# Patient Record
Sex: Female | Born: 1940 | Race: White | Hispanic: No | Smoking: Former smoker
Health system: Southern US, Community
[De-identification: ages and names within clinical notes are randomized; demographics above are authoritative.]

## PROBLEM LIST (undated history)

## (undated) DIAGNOSIS — C801 Malignant (primary) neoplasm, unspecified: Secondary | ICD-10-CM

## (undated) DIAGNOSIS — T7840XA Allergy, unspecified, initial encounter: Secondary | ICD-10-CM

## (undated) DIAGNOSIS — R600 Localized edema: Secondary | ICD-10-CM

## (undated) DIAGNOSIS — R109 Unspecified abdominal pain: Secondary | ICD-10-CM

## (undated) DIAGNOSIS — Z85828 Personal history of other malignant neoplasm of skin: Secondary | ICD-10-CM

## (undated) DIAGNOSIS — G518 Other disorders of facial nerve: Secondary | ICD-10-CM

## (undated) DIAGNOSIS — I5042 Chronic combined systolic (congestive) and diastolic (congestive) heart failure: Secondary | ICD-10-CM

## (undated) DIAGNOSIS — L03115 Cellulitis of right lower limb: Secondary | ICD-10-CM

## (undated) DIAGNOSIS — I429 Cardiomyopathy, unspecified: Secondary | ICD-10-CM

## (undated) HISTORY — DX: Chronic combined systolic (congestive) and diastolic (congestive) heart failure: I50.42

## (undated) HISTORY — DX: Other disorders of facial nerve: G51.8

## (undated) HISTORY — PX: TONSILLECTOMY AND ADENOIDECTOMY: SHX28

## (undated) HISTORY — DX: Cellulitis of left lower limb: L03.115

## (undated) HISTORY — DX: Unspecified abdominal pain: R10.9

## (undated) HISTORY — DX: Malignant (primary) neoplasm, unspecified: C80.1

## (undated) HISTORY — DX: Allergy, unspecified, initial encounter: T78.40XA

## (undated) HISTORY — DX: Localized edema: R60.0

## (undated) HISTORY — DX: Cardiomyopathy, unspecified: I42.9

## (undated) HISTORY — DX: Personal history of other malignant neoplasm of skin: Z85.828

## (undated) HISTORY — PX: ABDOMINAL HYSTERECTOMY: SHX81

---

## 2001-02-19 ENCOUNTER — Encounter: Payer: Self-pay | Admitting: Emergency Medicine

## 2001-02-19 ENCOUNTER — Observation Stay (HOSPITAL_COMMUNITY): Admission: EM | Admit: 2001-02-19 | Discharge: 2001-02-20 | Payer: Self-pay | Admitting: Emergency Medicine

## 2001-06-21 ENCOUNTER — Encounter: Admission: RE | Admit: 2001-06-21 | Discharge: 2001-06-21 | Payer: Self-pay | Admitting: Internal Medicine

## 2001-06-21 ENCOUNTER — Encounter: Payer: Self-pay | Admitting: Internal Medicine

## 2002-10-18 ENCOUNTER — Encounter: Payer: Self-pay | Admitting: Internal Medicine

## 2002-10-18 ENCOUNTER — Encounter: Admission: RE | Admit: 2002-10-18 | Discharge: 2002-10-18 | Payer: Self-pay | Admitting: Internal Medicine

## 2003-10-31 ENCOUNTER — Encounter: Admission: RE | Admit: 2003-10-31 | Discharge: 2003-10-31 | Payer: Self-pay | Admitting: Internal Medicine

## 2004-10-17 ENCOUNTER — Ambulatory Visit: Payer: Self-pay | Admitting: Internal Medicine

## 2005-05-28 ENCOUNTER — Encounter: Admission: RE | Admit: 2005-05-28 | Discharge: 2005-05-28 | Payer: Self-pay | Admitting: Internal Medicine

## 2006-05-31 ENCOUNTER — Encounter: Admission: RE | Admit: 2006-05-31 | Discharge: 2006-05-31 | Payer: Self-pay | Admitting: Internal Medicine

## 2006-09-28 ENCOUNTER — Ambulatory Visit: Payer: Self-pay | Admitting: Internal Medicine

## 2006-09-28 LAB — CONVERTED CEMR LAB
Albumin: 3.7 g/dL (ref 3.5–5.2)
Basophils Relative: 0.1 % (ref 0.0–1.0)
CO2: 32 meq/L (ref 19–32)
Chloride: 104 meq/L (ref 96–112)
Chol/HDL Ratio, serum: 2.6
Cholesterol: 213 mg/dL (ref 0–200)
Creatinine, Ser: 0.7 mg/dL (ref 0.4–1.2)
Eosinophil percent: 2.6 % (ref 0.0–5.0)
Glucose, Bld: 81 mg/dL (ref 70–99)
HDL: 81.1 mg/dL (ref >39.0)
Monocytes Absolute: 0.5 10*3/uL (ref 0.2–0.7)
Neutro Abs: 3.4 10*3/uL (ref 1.4–7.7)
Platelets: 251 10*3/uL (ref 150–400)
RBC: 4.26 M/uL (ref 3.87–5.11)
Sodium: 144 meq/L (ref 135–145)
Total Protein: 6.4 g/dL (ref 6.0–8.3)
Triglyceride fasting, serum: 32 mg/dL (ref 0–149)
WBC: 6 10*3/uL (ref 4.5–10.5)

## 2006-10-05 ENCOUNTER — Ambulatory Visit: Payer: Self-pay | Admitting: Internal Medicine

## 2007-03-02 ENCOUNTER — Emergency Department (HOSPITAL_COMMUNITY): Admission: EM | Admit: 2007-03-02 | Discharge: 2007-03-03 | Payer: Self-pay | Admitting: Emergency Medicine

## 2007-03-27 ENCOUNTER — Emergency Department (HOSPITAL_COMMUNITY): Admission: EM | Admit: 2007-03-27 | Discharge: 2007-03-27 | Payer: Self-pay | Admitting: Emergency Medicine

## 2007-07-26 ENCOUNTER — Encounter: Admission: RE | Admit: 2007-07-26 | Discharge: 2007-07-26 | Payer: Self-pay | Admitting: Internal Medicine

## 2007-08-08 ENCOUNTER — Ambulatory Visit: Payer: Self-pay | Admitting: Internal Medicine

## 2008-03-30 ENCOUNTER — Ambulatory Visit: Payer: Self-pay | Admitting: Family Medicine

## 2008-03-30 DIAGNOSIS — L259 Unspecified contact dermatitis, unspecified cause: Secondary | ICD-10-CM | POA: Insufficient documentation

## 2008-07-30 ENCOUNTER — Encounter: Admission: RE | Admit: 2008-07-30 | Discharge: 2008-07-30 | Payer: Self-pay | Admitting: Internal Medicine

## 2008-08-02 ENCOUNTER — Encounter: Admission: RE | Admit: 2008-08-02 | Discharge: 2008-08-02 | Payer: Self-pay | Admitting: Internal Medicine

## 2009-03-13 ENCOUNTER — Ambulatory Visit: Payer: Self-pay | Admitting: Internal Medicine

## 2009-03-13 DIAGNOSIS — R634 Abnormal weight loss: Secondary | ICD-10-CM | POA: Insufficient documentation

## 2009-03-13 DIAGNOSIS — N63 Unspecified lump in unspecified breast: Secondary | ICD-10-CM

## 2009-03-13 LAB — CONVERTED CEMR LAB: Prealbumin: 23.2 mg/dL (ref 18.0–45.0)

## 2009-03-18 LAB — CONVERTED CEMR LAB
ALT: 22 units/L (ref 0–35)
Bilirubin, Direct: 0.1 mg/dL (ref 0.0–0.3)
Free T4: 0.9 ng/dL (ref 0.6–1.6)
TSH: 0.81 microintl units/mL (ref 0.35–5.50)
Total Bilirubin: 1.3 mg/dL — ABNORMAL HIGH (ref 0.3–1.2)

## 2009-04-02 ENCOUNTER — Encounter: Payer: Self-pay | Admitting: Internal Medicine

## 2009-04-02 ENCOUNTER — Encounter: Admission: RE | Admit: 2009-04-02 | Discharge: 2009-04-02 | Payer: Self-pay | Admitting: Surgery

## 2009-08-14 ENCOUNTER — Encounter: Admission: RE | Admit: 2009-08-14 | Discharge: 2009-08-14 | Payer: Self-pay | Admitting: Internal Medicine

## 2010-07-16 ENCOUNTER — Ambulatory Visit: Payer: Self-pay | Admitting: Internal Medicine

## 2010-08-26 ENCOUNTER — Encounter: Admission: RE | Admit: 2010-08-26 | Discharge: 2010-08-26 | Payer: Self-pay | Admitting: Internal Medicine

## 2010-11-22 ENCOUNTER — Encounter: Payer: Self-pay | Admitting: Internal Medicine

## 2010-11-23 ENCOUNTER — Encounter: Payer: Self-pay | Admitting: Internal Medicine

## 2010-12-02 NOTE — Assessment & Plan Note (Signed)
Summary: FLU SHOT/RCD   Nurse Visit   Review of Systems       Flu Vaccine Consent Questions     Do you have a history of severe allergic reactions to this vaccine? no    Any prior history of allergic reactions to egg and/or gelatin? no    Do you have a sensitivity to the preservative Thimersol? no    Do you have a past history of Guillan-Barre Syndrome? no    Do you currently have an acute febrile illness? no    Have you ever had a severe reaction to latex? no    Vaccine information given and explained to patient? yes    Are you currently pregnant? no    Lot Number:[id number]   Exp Date:05/02/2011   Site Given  Left Deltoid IM    Allergies: No Known Drug Allergies  Orders Added: 1)  Flu Vaccine 3yrs + MEDICARE PATIENTS [Q2039] 2)  Administration Flu vaccine - MCR [G0008] 

## 2011-03-20 NOTE — Discharge Summary (Signed)
[place]. [facility]  Patient:    Brandy Conway, Brandy Conway                           MRN: [id number] Adm. Date:  [id number] Disc. Date: [id number] Attending:  Carrie Mew CC:         Stacie Glaze, M.D. [facility]   Discharge Summary  ADMISSION DIAGNOSES: 1. Chest pain. 2. Recurrent abdominal pain. 3. Degenerative joint disease. 4. Peripheral vascular disease manifested as varicose veins.  DISCHARGE DIAGNOSES: 1. Chest pain. 2. Recurrent abdominal pain. 3. Degenerative joint disease. 4. Peripheral vascular disease manifested as varicose veins.  BRIEF ADMISSION HISTORY:  Brandy Conway is a 70 year old Caucasian female who developed chest pain on April 19 which was progressive over several hours.  It disappeared while asleep, but upon awakening April 20, she had paresthesias of the left upper extremity with tingling.  The chest pain subsequently returned and prompted an ER visit.  The pain was described as substernal or epigastric without radiation.  She had nausea, but no diaphoresis.  Significantly, she is physically active on a stationary bike daily for half an hour without cardiopulmonary symptoms.  There is no family history of stroke, heart attack, or diabetes.  She has been evaluated by Dr. Lovell Sheehan extensively for lower abdominal and back pain which has been intermittent over the last three and a half years.  She was admitted to rule out infarction.  She was admitted to telemetry with monitor.  LABORATORY AND X-RAY DATA:  Serial CPKs were within normal limits except for the second one with a value of 251.  The relative index was normal.  Troponin was negative x 3.  Her hematocrit was 34.8.  Amylase and lipase were normal.  EKG was normal with minor T wave changes manifested by an inverted T in aVL and flat T wave in V2.  Chest x-ray revealed no active disease.  HOSPITAL COURSE:  The pain resolved, and her only complaint on the morning of April 21 was  fatigue from being bedridden as she is normally physically active.  In view of the negative tests above including comprehensive metabolic profile, she will be discharged with an outpatient workup.  Outpatient stress cardiolite will be scheduled.  She will be asked to curtail her exercise pattern until that stress Cardiolite is performed.  She did receive IV heparin as per protocol during the hospitalization, but this was stopped the morning of April 21.  DISCHARGE MEDICATIONS:  She was discharged on Protonix 40 mg q.d.  DISCHARGE INSTRUCTIONS:  Diet will be of choice.  Activities are as noted with the above restrictions.  SPECIAL DISCHARGE INSTRUCTIONS:  Because of the mild anemia manifested on repeat CBC with hematocrit of 34.8, outpatient fecal occult blood testing would be recommended.  Her initial hematocrit on April 20 was 40.6 prior to multiple blood draws and IV fluids. DD:  02/20/01 TD:  02/21/01 Job: [id number]

## 2011-03-20 NOTE — H&P (Signed)
[place]. [facility]  Patient:    Brandy Conway, Brandy Conway                           MRN: [id number] Adm. Date:  [id number] Disc. Date: [id number] Attending:  Carrie Mew CC:         Stacie Glaze, M.D. [facility]   History and Physical  DATE OF BIRTH: [birth year]  CHIEF COMPLAINT: Brandy Conway is a 70 year old white female admitted with chest pain to rule out crescendo angina.  HISTORY OF PRESENT ILLNESS: She began to have chest pain on February 18, 2001. She stated that it progressed through the afternoon into the evening.  She went to bed and did not treat the pain.  Upon awakening it had resolved but she had paresthesias of the left upper extremity and hand, manifested as tingling.  The chest pain returned as of 11 a.m. today, prompting the ER visit.  She has had intermittent chest pain after climbing stairs associated with shortness of breath.  Significantly, she also uses a stationary bike for half an hour a day without cardiopulmonary symptoms, and also walks.  The pain was described as being in the epigastric substernal area without radiation.  It was associated with nausea but no diaphoresis.  PAST MEDICAL/SURGICAL HISTORY:  1. Tonsillectomy and adenoidectomy.  2. One pregnancy, one delivery.  3. Total abdominal hysterectomy for dysfunction menses.  4. As a teen she was hospitalized with abdominal pain but did not have an     appendectomy.  FAMILY HISTORY: There is no heart attack, stroke, or diabetes in the family. Her mother had congestive heart failure.  A son tragically died at age 78-1/2 of CNS malignancy.  A grandfather had cancer, questionably of the liver.  SOCIAL HISTORY: She quit smoking 25 years ago.  She drinks occasionally.  She has a strong spiritual faith.  CURRENT MEDICATIONS:  1. Aspirin.  2. Multivitamin.  She is on no other prescription drugs.  ALLERGIES: No known drug allergies.  REVIEW OF SYSTEMS: Some lower  abdominal pain with back radiation.  This has been attributed to degenerative joint disease after detailed multiple evaluations by Dr. Lovell Sheehan over the last 3-1/2 years.  It had been her concern that she had ovarian cancer but apparent radiographic studies and endoscopic studies have been negative.  She did have microscopic hematuria several years ago and saw Dr. Annabell Howells.  She had a varicose vein bleed dramatically and had to have it sutured in the emergency room.  PHYSICAL EXAMINATION:  GENERAL: She is thin, but in no acute distress.  She has no lymphadenopathy or organomegaly.  VITAL SIGNS: Pulse 97, with normal sinus rhythm on telemetry.  Respiratory rate was 17-20.  CHEST:  No increased work of breathing.  Chest clear.  There is a prominent pulsating artery above the arterial structure above the left clavicle.  HEENT: Fundal examination normal.  Otolaryngologic examination is negative. Tympanic membranes are slightly dull but there is no bulging suggested. Dental hygiene is excellent.  HEART: She has an S4 without murmur or gallop.  NECK: Thyroid asymmetric, right greater than left.  ABDOMEN: Slightly tender over the lower abdomen, but she has no masses.  EXTREMITIES: Stasis dermatitis changes are noted over the lower extremities in splotchy distribution.  She has varicose veins over the lower extremities. Homans sign is negative.  Pedal pulses are intact.  EKG reveals incomplete right bundle branch block, with no  ischemic change.  IMPRESSION/PLAN: She is admitted to rule out impending infarction.  She will be placed on heparin protocol but because of the past history of abdominal pain Hemoccult studies will be performed.  If cardiac enzymes are negative a stress Cardiolite as an outpatient could be pursued. DD:  02/20/00 TD:  02/21/01 Job: [id number]

## 2011-08-28 ENCOUNTER — Other Ambulatory Visit: Payer: Self-pay | Admitting: Internal Medicine

## 2011-08-28 DIAGNOSIS — Z1231 Encounter for screening mammogram for malignant neoplasm of breast: Secondary | ICD-10-CM

## 2011-09-23 ENCOUNTER — Ambulatory Visit
Admission: RE | Admit: 2011-09-23 | Discharge: 2011-09-23 | Disposition: A | Discharge status: Home / Self Care | Payer: PRIVATE HEALTH INSURANCE | Source: Ambulatory Visit | Attending: Internal Medicine | Admitting: Internal Medicine

## 2011-09-23 DIAGNOSIS — Z1231 Encounter for screening mammogram for malignant neoplasm of breast: Secondary | ICD-10-CM

## 2012-05-13 ENCOUNTER — Encounter: Payer: Self-pay | Admitting: Internal Medicine

## 2012-05-13 ENCOUNTER — Ambulatory Visit (INDEPENDENT_AMBULATORY_CARE_PROVIDER_SITE_OTHER): Payer: Medicare Other | Admitting: Internal Medicine

## 2012-05-13 VITALS — BP 136/80 | HR 80 | Temp 98.1°F | Resp 16 | Ht 67.0 in | Wt 124.0 lb

## 2012-05-13 DIAGNOSIS — J019 Acute sinusitis, unspecified: Secondary | ICD-10-CM | POA: Diagnosis not present

## 2012-05-13 DIAGNOSIS — B9689 Other specified bacterial agents as the cause of diseases classified elsewhere: Secondary | ICD-10-CM

## 2012-05-13 MED ORDER — MOXIFLOXACIN HCL 400 MG PO TABS: 400.0000 mg | ORAL_TABLET | Freq: Every day | ORAL | Status: AC

## 2012-05-13 NOTE — Patient Instructions (Addendum)
Mucinex cough and cold fast Max liquid Take as directed on the bottle

## 2012-05-13 NOTE — Progress Notes (Signed)
  Subjective:    Patient ID: Brandy Conway, female    DOB: 1941-10-30, 71 y.o.   MRN: [id number]  HPI The pt noted cough and congestion Worse when she lays down Running a fever Under stress with husband serious illness Cough is non productive Protein calorie malnutrition Increased abd pain with cough     Review of Systems  Constitutional: Negative for activity change, appetite change and fatigue.  HENT: Positive for congestion and rhinorrhea. Negative for ear pain, neck pain, postnasal drip and sinus pressure.   Eyes: Negative for redness and visual disturbance.  Respiratory: Positive for cough, shortness of breath and stridor. Negative for wheezing.   Gastrointestinal: Negative for abdominal pain and abdominal distention.  Genitourinary: Negative for dysuria, frequency and menstrual problem.  Musculoskeletal: Negative for myalgias, joint swelling and arthralgias.  Skin: Negative for rash and wound.  Neurological: Negative for dizziness, weakness and headaches.  Hematological: Negative for adenopathy. Does not bruise/bleed easily.  Psychiatric/Behavioral: Negative for disturbed wake/sleep cycle and decreased concentration.       Objective:   Physical Exam  Constitutional: She appears well-developed and well-nourished.  Eyes: Conjunctivae are normal. Pupils are equal, round, and reactive to light.  Pulmonary/Chest: Effort normal and breath sounds normal.  Skin: Skin is warm and dry.    Marked drainage from sinuses Posterior pharynx is erythematous lung fields are clear      Assessment & Plan:  I think she has a severe sinus infection with postnasal drip the postnasal drip is severe enough that this is causing a choking sensation causing her cough when she lies down we will treat her with an antibiotic Avelox for 7 days and will suggest Robitussin Cough and cold fast Max liquid to control the congestion and cough.

## 2012-07-24 DIAGNOSIS — Z23 Encounter for immunization: Secondary | ICD-10-CM | POA: Diagnosis not present

## 2012-08-29 ENCOUNTER — Other Ambulatory Visit: Payer: Self-pay | Admitting: Internal Medicine

## 2012-08-29 DIAGNOSIS — Z1231 Encounter for screening mammogram for malignant neoplasm of breast: Secondary | ICD-10-CM

## 2012-10-10 ENCOUNTER — Ambulatory Visit
Admission: RE | Admit: 2012-10-10 | Discharge: 2012-10-10 | Disposition: A | Discharge status: Home / Self Care | Payer: Medicare Other | Source: Ambulatory Visit | Attending: Internal Medicine | Admitting: Internal Medicine

## 2012-10-10 DIAGNOSIS — Z1231 Encounter for screening mammogram for malignant neoplasm of breast: Secondary | ICD-10-CM | POA: Diagnosis not present

## 2013-07-19 ENCOUNTER — Ambulatory Visit (INDEPENDENT_AMBULATORY_CARE_PROVIDER_SITE_OTHER): Payer: Medicare Other | Admitting: Internal Medicine

## 2013-07-19 DIAGNOSIS — Z23 Encounter for immunization: Secondary | ICD-10-CM | POA: Diagnosis not present

## 2014-05-02 ENCOUNTER — Other Ambulatory Visit: Payer: Self-pay

## 2014-05-02 DIAGNOSIS — Z1231 Encounter for screening mammogram for malignant neoplasm of breast: Secondary | ICD-10-CM

## 2014-05-22 ENCOUNTER — Encounter (INDEPENDENT_AMBULATORY_CARE_PROVIDER_SITE_OTHER): Payer: Self-pay

## 2014-05-22 ENCOUNTER — Ambulatory Visit
Admission: RE | Admit: 2014-05-22 | Discharge: 2014-05-22 | Disposition: A | Discharge status: Home / Self Care | Payer: Medicare Other | Source: Ambulatory Visit

## 2014-05-22 DIAGNOSIS — Z1231 Encounter for screening mammogram for malignant neoplasm of breast: Secondary | ICD-10-CM

## 2014-06-27 DIAGNOSIS — I8390 Asymptomatic varicose veins of unspecified lower extremity: Secondary | ICD-10-CM | POA: Diagnosis not present

## 2014-06-27 DIAGNOSIS — L821 Other seborrheic keratosis: Secondary | ICD-10-CM | POA: Diagnosis not present

## 2014-06-27 DIAGNOSIS — Z85828 Personal history of other malignant neoplasm of skin: Secondary | ICD-10-CM | POA: Diagnosis not present

## 2014-06-27 DIAGNOSIS — D485 Neoplasm of uncertain behavior of skin: Secondary | ICD-10-CM | POA: Diagnosis not present

## 2014-06-27 DIAGNOSIS — L570 Actinic keratosis: Secondary | ICD-10-CM | POA: Diagnosis not present

## 2014-06-27 DIAGNOSIS — C44319 Basal cell carcinoma of skin of other parts of face: Secondary | ICD-10-CM | POA: Diagnosis not present

## 2014-06-27 DIAGNOSIS — I83893 Varicose veins of bilateral lower extremities with other complications: Secondary | ICD-10-CM | POA: Diagnosis not present

## 2014-07-17 DIAGNOSIS — M2040 Other hammer toe(s) (acquired), unspecified foot: Secondary | ICD-10-CM | POA: Diagnosis not present

## 2014-07-25 DIAGNOSIS — C44319 Basal cell carcinoma of skin of other parts of face: Secondary | ICD-10-CM | POA: Diagnosis not present

## 2014-08-10 DIAGNOSIS — Z23 Encounter for immunization: Secondary | ICD-10-CM | POA: Diagnosis not present

## 2014-09-24 ENCOUNTER — Ambulatory Visit: Payer: Medicare Other | Admitting: Family Medicine

## 2014-09-24 ENCOUNTER — Encounter: Payer: Self-pay | Admitting: Family Medicine

## 2014-09-24 ENCOUNTER — Ambulatory Visit (INDEPENDENT_AMBULATORY_CARE_PROVIDER_SITE_OTHER): Payer: Medicare Other | Admitting: Family Medicine

## 2014-09-24 VITALS — BP 100/64 | HR 103 | Temp 98.5°F | Wt 123.0 lb

## 2014-09-24 DIAGNOSIS — G500 Trigeminal neuralgia: Secondary | ICD-10-CM | POA: Insufficient documentation

## 2014-09-24 DIAGNOSIS — Z85828 Personal history of other malignant neoplasm of skin: Secondary | ICD-10-CM | POA: Insufficient documentation

## 2014-09-24 DIAGNOSIS — E785 Hyperlipidemia, unspecified: Secondary | ICD-10-CM | POA: Insufficient documentation

## 2014-09-24 DIAGNOSIS — Z23 Encounter for immunization: Secondary | ICD-10-CM | POA: Diagnosis not present

## 2014-09-24 HISTORY — DX: Hyperlipidemia, unspecified: E78.5

## 2014-09-24 HISTORY — DX: Trigeminal neuralgia: G50.0

## 2014-09-24 LAB — CBC
HCT: 42.5 % (ref 36.0–46.0)
Hemoglobin: 13.9 g/dL (ref 12.0–15.0)
MCHC: 32.6 g/dL (ref 30.0–36.0)
MCV: 92.9 fl (ref 78.0–100.0)
Platelets: 224 10*3/uL (ref 150.0–400.0)
RBC: 4.58 Mil/uL (ref 3.87–5.11)
RDW: 13.1 % (ref 11.5–15.5)
WBC: 4.3 10*3/uL (ref 4.0–10.5)

## 2014-09-24 LAB — LIPID PANEL
Cholesterol: 217 mg/dL — ABNORMAL HIGH (ref 0–200)
HDL: 96 mg/dL (ref >39.00)
LDL CALC: 110 mg/dL — AB (ref 0–99)
NONHDL: 121
TRIGLYCERIDES: 54 mg/dL (ref 0.0–149.0)
Total CHOL/HDL Ratio: 2
VLDL: 10.8 mg/dL (ref 0.0–40.0)

## 2014-09-24 LAB — COMPREHENSIVE METABOLIC PANEL
ALT: 16 U/L (ref 0–35)
AST: 22 U/L (ref 0–37)
Albumin: 4.6 g/dL (ref 3.5–5.2)
Alkaline Phosphatase: 47 U/L (ref 39–117)
BILIRUBIN TOTAL: 0.8 mg/dL (ref 0.2–1.2)
BUN: 13 mg/dL (ref 6–23)
CHLORIDE: 104 meq/L (ref 96–112)
CO2: 27 meq/L (ref 19–32)
Calcium: 9.8 mg/dL (ref 8.4–10.5)
Creatinine, Ser: 0.6 mg/dL (ref 0.4–1.2)
GFR: 112.78 mL/min (ref >60.00)
GLUCOSE: 100 mg/dL — AB (ref 70–99)
Potassium: 4.7 mEq/L (ref 3.5–5.1)
Sodium: 143 mEq/L (ref 135–145)
Total Protein: 7.1 g/dL (ref 6.0–8.3)

## 2014-09-24 LAB — TSH: TSH: 1.19 u[IU]/mL (ref 0.35–4.50)

## 2014-09-24 NOTE — Progress Notes (Signed)
[name], MD Phone: [phone]  Subjective:  Patient presents today to establish care with me as their new primary care provider. Patient was formerly a patient of Dr. Arnoldo Morale. Chief complaint-noted.   Hyperlipidemia-mild poor control Last LDL >120 in 2010.  On statin: no Regular exercise: through retirement community-does 5 days a week for 30 minutes, Nustep for another 30 minutes ROS- no chest pain or shortness of breath. No myalgias Denies depressive symptoms but occasional day where is tearful especially around the holidays. left sided headache with recent flare of facial nerve pain  The following were reviewed and entered/updated in epic: Past Medical History  Diagnosis Date  . Allergy   . Facial nerve sensory disorder   . History of skin cancer    Patient Active Problem List   Diagnosis Date Noted  . Hyperlipidemia 09/24/2014    Priority: Medium  . Trigeminal neuralgia 09/24/2014    Priority: Low  . History of skin cancer    Past Surgical History  Procedure Laterality Date  . Abdominal hysterectomy    . Tonsillectomy and adenoidectomy      Family History  Problem Relation Age of Onset  . Heart disease Mother     CHF  . Heart disease Brother 75    stents    Medications- reviewed and updated Current Outpatient Prescriptions  Medication Sig Dispense Refill  . aspirin 325 MG buffered tablet Take 325 mg by mouth as needed.     No current facility-administered medications for this visit.    Allergies-reviewed and updated No Known Allergies  History   Social History  . Marital Status: Married    Spouse Name: N/A    Number of Children: N/A  . Years of Education: N/A   Social History Main Topics  . Smoking status: Former Smoker -- 0.50 packs/day for 10 years    Types: Cigarettes    Quit date: 11/02/1973  . Smokeless tobacco: None  . Alcohol Use: No  . Drug Use: No  . Sexual Activity: No   Other Topics Concern  . None   Social History  Narrative   Widowed in March 2015-sick for a long time. 1 son- lost at age 19.5 sudden death-malignant brain tumor ruptured. No pets.       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 No rebound or guarding.  Ext: trace edema despite compression stockings Skin: warm, dry, no rash, 2 x 2 cm healed scar on top of head from skin cancer removal without signs of reoccurence Neuro: grossly normal, moves all extremities   Results for orders placed or performed in visit on 09/24/14 (from the past 24 hour(s))  CBC     Status: None   Collection Time: 09/24/14 12:18 PM  Result Value Ref Range   WBC 4.3 4.0 - 10.5 K/uL   RBC 4.58 3.87 - 5.11 Mil/uL   Platelets 224.0 150.0 - 400.0 K/uL   Hemoglobin 13.9 12.0 - 15.0 g/dL   HCT 42.5 36.0 - 46.0 %   MCV 92.9 78.0 -  100.0 fl   MCHC 32.6 30.0 - 36.0 g/dL   RDW 13.1 11.5 - 15.5 %  Comprehensive metabolic panel     Status: Abnormal   Collection Time: 09/24/14 12:18 PM  Result Value Ref Range   Sodium 143 135 - 145 mEq/L   Potassium 4.7 3.5 - 5.1 mEq/L   Chloride 104 96 - 112 mEq/L   CO2 27 19 - 32 mEq/L   Glucose, Bld 100 (H) 70 - 99 mg/dL   BUN 13 6 - 23 mg/dL   Creatinine, Ser 0.6 0.4 - 1.2 mg/dL   Total Bilirubin 0.8 0.2 - 1.2 mg/dL   Alkaline Phosphatase 47 39 - 117 U/L   AST 22 0 - 37 U/L   ALT 16 0 - 35 U/L   Total Protein 7.1 6.0 - 8.3 g/dL   Albumin 4.6 3.5 - 5.2 g/dL   Calcium 9.8 8.4 - 10.5 mg/dL   GFR 112.78 >60.00 mL/min  Lipid panel     Status: Abnormal   Collection Time:  09/24/14 12:18 PM  Result Value Ref Range   Cholesterol 217 (H) 0 - 200 mg/dL   Triglycerides 54.0 0.0 - 149.0 mg/dL   HDL 96.00 >39.00 mg/dL   VLDL 10.8 0.0 - 40.0 mg/dL   LDL Cholesterol 110 (H) 0 - 99 mg/dL   Total CHOL/HDL Ratio 2    NonHDL 121.00   TSH     Status: None   Collection Time: 09/24/14 12:18 PM  Result Value Ref Range   TSH 1.19 0.35 - 4.50 uIU/mL   Assessment/Plan:  Hyperlipidemia LDL at 110 so technically mild poor control. 10 year risk at 8.1% which is incredibly low given age. Given regular exercise, weak family history other than brother with stents at age 20, and approaching age where primary prevention benefit is unclear, opted to not place patient on statin. Consider asa if risk gets above 10%   Fasting labs (cup of black coffee only)  Orders Placed This Encounter  Procedures  . Pneumococcal conjugate vaccine 13-valent  . CBC    [place]  . Comprehensive metabolic panel    [place]    Order Specific Question:  Has the patient fasted?    Answer:  No  . Lipid panel    [place]    Order Specific Question:  Has the patient fasted?    Answer:  No  . TSH    [place]

## 2014-09-24 NOTE — Patient Instructions (Addendum)
Things look great. You do have high cholesterol and we are going to update your labs today based on this.   Got your final pneumonia shot today since you got your pneumovax at age 73.

## 2014-09-24 NOTE — Assessment & Plan Note (Addendum)
LDL at 110 so technically mild poor control. 10 year risk at 8.1% which is incredibly low given age. Given regular exercise, weak family history other than brother with stents at age 73, and approaching age where primary prevention benefit is unclear, opted to not place patient on statin. Consider asa if risk gets above 10%

## 2014-11-08 ENCOUNTER — Emergency Department (HOSPITAL_COMMUNITY): Payer: Medicare Other

## 2014-11-08 ENCOUNTER — Emergency Department (HOSPITAL_COMMUNITY)
Admission: EM | Admit: 2014-11-08 | Discharge: 2014-11-08 | Disposition: A | Discharge status: Home / Self Care | Payer: Medicare Other | Attending: Emergency Medicine | Admitting: Emergency Medicine

## 2014-11-08 ENCOUNTER — Encounter (HOSPITAL_COMMUNITY): Payer: Self-pay | Admitting: Emergency Medicine

## 2014-11-08 ENCOUNTER — Telehealth: Payer: Self-pay | Admitting: Family Medicine

## 2014-11-08 DIAGNOSIS — R0602 Shortness of breath: Secondary | ICD-10-CM | POA: Diagnosis not present

## 2014-11-08 DIAGNOSIS — R0600 Dyspnea, unspecified: Secondary | ICD-10-CM | POA: Diagnosis not present

## 2014-11-08 DIAGNOSIS — R509 Fever, unspecified: Secondary | ICD-10-CM | POA: Diagnosis not present

## 2014-11-08 DIAGNOSIS — J159 Unspecified bacterial pneumonia: Secondary | ICD-10-CM | POA: Insufficient documentation

## 2014-11-08 DIAGNOSIS — Z8669 Personal history of other diseases of the nervous system and sense organs: Secondary | ICD-10-CM | POA: Insufficient documentation

## 2014-11-08 DIAGNOSIS — Z87891 Personal history of nicotine dependence: Secondary | ICD-10-CM | POA: Insufficient documentation

## 2014-11-08 DIAGNOSIS — Z7982 Long term (current) use of aspirin: Secondary | ICD-10-CM | POA: Insufficient documentation

## 2014-11-08 DIAGNOSIS — Z85828 Personal history of other malignant neoplasm of skin: Secondary | ICD-10-CM | POA: Insufficient documentation

## 2014-11-08 DIAGNOSIS — J189 Pneumonia, unspecified organism: Secondary | ICD-10-CM | POA: Diagnosis not present

## 2014-11-08 DIAGNOSIS — R918 Other nonspecific abnormal finding of lung field: Secondary | ICD-10-CM | POA: Diagnosis not present

## 2014-11-08 DIAGNOSIS — J439 Emphysema, unspecified: Secondary | ICD-10-CM | POA: Diagnosis not present

## 2014-11-08 DIAGNOSIS — R000 Tachycardia, unspecified: Secondary | ICD-10-CM | POA: Diagnosis not present

## 2014-11-08 DIAGNOSIS — R05 Cough: Secondary | ICD-10-CM | POA: Diagnosis present

## 2014-11-08 LAB — BASIC METABOLIC PANEL
ANION GAP: 8 (ref 5–15)
BUN: 10 mg/dL (ref 6–23)
CHLORIDE: 102 meq/L (ref 96–112)
CO2: 25 mmol/L (ref 19–32)
CREATININE: 0.49 mg/dL — AB (ref 0.50–1.10)
Calcium: 8.7 mg/dL (ref 8.4–10.5)
GFR calc non Af Amer: >90 mL/min (ref >90)
GLUCOSE: 140 mg/dL — AB (ref 70–99)
Potassium: 3.5 mmol/L (ref 3.5–5.1)
Sodium: 135 mmol/L (ref 135–145)

## 2014-11-08 LAB — CBC WITH DIFFERENTIAL/PLATELET
BASOS ABS: 0 10*3/uL (ref 0.0–0.1)
BASOS PCT: 0 % (ref 0–1)
EOS PCT: 0 % (ref 0–5)
Eosinophils Absolute: 0 10*3/uL (ref 0.0–0.7)
HCT: 36.8 % (ref 36.0–46.0)
Hemoglobin: 11.9 g/dL — ABNORMAL LOW (ref 12.0–15.0)
LYMPHS PCT: 9 % — AB (ref 12–46)
Lymphs Abs: 0.9 10*3/uL (ref 0.7–4.0)
MCH: 29.8 pg (ref 26.0–34.0)
MCHC: 32.3 g/dL (ref 30.0–36.0)
MCV: 92.2 fL (ref 78.0–100.0)
MONO ABS: 1 10*3/uL (ref 0.1–1.0)
Monocytes Relative: 10 % (ref 3–12)
NEUTROS ABS: 8 10*3/uL — AB (ref 1.7–7.7)
NEUTROS PCT: 81 % — AB (ref 43–77)
PLATELETS: 234 10*3/uL (ref 150–400)
RBC: 3.99 MIL/uL (ref 3.87–5.11)
RDW: 12.5 % (ref 11.5–15.5)
WBC: 10 10*3/uL (ref 4.0–10.5)

## 2014-11-08 LAB — I-STAT TROPONIN, ED: TROPONIN I, POC: 0 ng/mL (ref 0.00–0.08)

## 2014-11-08 LAB — BRAIN NATRIURETIC PEPTIDE: B NATRIURETIC PEPTIDE 5: 144.9 pg/mL — AB (ref 0.0–100.0)

## 2014-11-08 MED ORDER — SODIUM CHLORIDE 0.9 % IV BOLUS (SEPSIS)
1000.0000 mL | Freq: Once | INTRAVENOUS | Status: AC
  Administered 2014-11-08: 1000 mL via INTRAVENOUS

## 2014-11-08 MED ORDER — AMOXICILLIN 500 MG PO CAPS: 1000.0000 mg | ORAL_CAPSULE | Freq: Two times a day (BID) | ORAL | Status: DC

## 2014-11-08 MED ORDER — DEXTROSE 5 % IV SOLN
1.0000 g | Freq: Once | INTRAVENOUS | Status: AC
  Administered 2014-11-08: 1 g via INTRAVENOUS
  Filled 2014-11-08: qty 10

## 2014-11-08 MED ORDER — IPRATROPIUM-ALBUTEROL 0.5-2.5 (3) MG/3ML IN SOLN
3.0000 mL | Freq: Once | RESPIRATORY_TRACT | Status: AC
  Administered 2014-11-08: 3 mL via RESPIRATORY_TRACT
  Filled 2014-11-08: qty 3

## 2014-11-08 MED ORDER — AZITHROMYCIN 250 MG PO TABS: 250.0000 mg | ORAL_TABLET | Freq: Every day | ORAL | Status: DC

## 2014-11-08 MED ORDER — IOHEXOL 350 MG/ML SOLN
80.0000 mL | Freq: Once | INTRAVENOUS | Status: AC | PRN
  Administered 2014-11-08: 80 mL via INTRAVENOUS

## 2014-11-08 MED ORDER — AZITHROMYCIN 250 MG PO TABS
500.0000 mg | ORAL_TABLET | Freq: Once | ORAL | Status: AC
  Administered 2014-11-08: 500 mg via ORAL
  Filled 2014-11-08: qty 2

## 2014-11-08 NOTE — ED Notes (Signed)
CT notified that the pt has an IV for her scan.

## 2014-11-08 NOTE — ED Notes (Signed)
Roxanne Mins, MD at bedside.

## 2014-11-08 NOTE — Telephone Encounter (Signed)
Wednesday 10:45. Call Monday or Tuesday if symptoms worsen or are not improving and we may change appointment.

## 2014-11-08 NOTE — Discharge Instructions (Signed)
Return to the ED if symptoms are getting worse.  Pneumonia Pneumonia is an infection of the lungs.  CAUSES Pneumonia may be caused by bacteria or a virus. Usually, these infections are caused by breathing infectious particles into the lungs (respiratory tract). SIGNS AND SYMPTOMS   Cough.  Fever.  Chest pain.  Increased rate of breathing.  Wheezing.  Mucus production. DIAGNOSIS  If you have the common symptoms of pneumonia, your health care provider will typically confirm the diagnosis with a chest X-ray. The X-ray will show an abnormality in the lung (pulmonary infiltrate) if you have pneumonia. Other tests of your blood, urine, or sputum may be done to find the specific cause of your pneumonia. Your health care provider may also do tests (blood gases or pulse oximetry) to see how well your lungs are working. TREATMENT  Some forms of pneumonia may be spread to other people when you cough or sneeze. You may be asked to wear a mask before and during your exam. Pneumonia that is caused by bacteria is treated with antibiotic medicine. Pneumonia that is caused by the influenza virus may be treated with an antiviral medicine. Most other viral infections must run their course. These infections will not respond to antibiotics.  HOME CARE INSTRUCTIONS   Cough suppressants may be used if you are losing too much rest. However, coughing protects you by clearing your lungs. You should avoid using cough suppressants if you can.  Your health care provider may have prescribed medicine if he or she thinks your pneumonia is caused by bacteria or influenza. Finish your medicine even if you start to feel better.  Your health care provider may also prescribe an expectorant. This loosens the mucus to be coughed up.  Take medicines only as directed by your health care provider.  Do not smoke. Smoking is a common cause of bronchitis and can contribute to pneumonia. If you are a smoker and continue to  smoke, your cough may last several weeks after your pneumonia has cleared.  A cold steam vaporizer or humidifier in your room or home may help loosen mucus.  Coughing is often worse at night. Sleeping in a semi-upright position in a recliner or using a couple pillows under your head will help with this.  Get rest as you feel it is needed. Your body will usually let you know when you need to rest. PREVENTION A pneumococcal shot (vaccine) is available to prevent a common bacterial cause of pneumonia. This is usually suggested for:  People over 6 years old.  Patients on chemotherapy.  People with chronic lung problems, such as bronchitis or emphysema.  People with immune system problems. If you are over 65 or have a high risk condition, you may receive the pneumococcal vaccine if you have not received it before. In some countries, a routine influenza vaccine is also recommended. This vaccine can help prevent some cases of pneumonia.You may be offered the influenza vaccine as part of your care. If you smoke, it is time to quit. You may receive instructions on how to stop smoking. Your health care provider can provide medicines and counseling to help you quit. SEEK MEDICAL CARE IF: You have a fever. SEEK IMMEDIATE MEDICAL CARE IF:   Your illness becomes worse. This is especially true if you are elderly or weakened from any other disease.  You cannot control your cough with suppressants and are losing sleep.  You begin coughing up blood.  You develop pain which is getting worse  or is uncontrolled with medicines.  Any of the symptoms which initially brought you in for treatment are getting worse rather than better.  You develop shortness of breath or chest pain. MAKE SURE YOU:   Understand these instructions.  Will watch your condition.  Will get help right away if you are not doing well or get worse. Document Released: 10/19/2005 Document Revised: 03/05/2014 Document Reviewed:  01/08/2011 [facility] Patient Information 2015 [place], [place]. This information is not intended to replace advice given to you by your health care provider. Make sure you discuss any questions you have with your health care provider.  Amoxicillin capsules or tablets What is this medicine? AMOXICILLIN (a mox i SIL in) is a penicillin antibiotic. It is used to treat certain kinds of bacterial infections. It will not work for colds, flu, or other viral infections. This medicine may be used for other purposes; ask your health care provider or pharmacist if you have questions. COMMON BRAND NAME(S): Amoxil, Moxilin, Sumox, Trimox What should I tell my health care provider before I take this medicine? They need to know if you have any of these conditions: -asthma -kidney disease -an unusual or allergic reaction to amoxicillin, other penicillins, cephalosporin antibiotics, other medicines, foods, dyes, or preservatives -pregnant or trying to get pregnant -breast-feeding How should I use this medicine? Take this medicine by mouth with a glass of water. Follow the directions on your prescription label. You may take this medicine with food or on an empty stomach. Take your medicine at regular intervals. Do not take your medicine more often than directed. Take all of your medicine as directed even if you think your are better. Do not skip doses or stop your medicine early. Talk to your pediatrician regarding the use of this medicine in children. While this drug may be prescribed for selected conditions, precautions do apply. Overdosage: If you think you have taken too much of this medicine contact a poison control center or emergency room at once. NOTE: This medicine is only for you. Do not share this medicine with others. What if I miss a dose? If you miss a dose, take it as soon as you can. If it is almost time for your next dose, take only that dose. Do not take double or extra doses. What may interact  with this medicine? -amiloride -birth control pills -chloramphenicol -macrolides -probenecid -sulfonamides -tetracyclines This list may not describe all possible interactions. Give your health care provider a list of all the medicines, herbs, non-prescription drugs, or dietary supplements you use. Also tell them if you smoke, drink alcohol, or use illegal drugs. Some items may interact with your medicine. What should I watch for while using this medicine? Tell your doctor or health care professional if your symptoms do not improve in 2 or 3 days. Take all of the doses of your medicine as directed. Do not skip doses or stop your medicine early. If you are diabetic, you may get a false positive result for sugar in your urine with certain brands of urine tests. Check with your doctor. Do not treat diarrhea with over-the-counter products. Contact your doctor if you have diarrhea that lasts more than 2 days or if the diarrhea is severe and watery. What side effects may I notice from receiving this medicine? Side effects that you should report to your doctor or health care professional as soon as possible: -allergic reactions like skin rash, itching or hives, swelling of the face, lips, or tongue -breathing problems -dark  urine -redness, blistering, peeling or loosening of the skin, including inside the mouth -seizures -severe or watery diarrhea -trouble passing urine or change in the amount of urine -unusual bleeding or bruising -unusually weak or tired -yellowing of the eyes or skin Side effects that usually do not require medical attention (report to your doctor or health care professional if they continue or are bothersome): -dizziness -headache -stomach upset -trouble sleeping This list may not describe all possible side effects. Call your doctor for medical advice about side effects. You may report side effects to FDA at 1-800-FDA-1088. Where should I keep my medicine? Keep out of the  reach of children. Store between 68 and 77 degrees F (20 and 25 degrees C). Keep bottle closed tightly. Throw away any unused medicine after the expiration date. NOTE: This sheet is a summary. It may not cover all possible information. If you have questions about this medicine, talk to your doctor, pharmacist, or health care provider.  2015, [employer]. (2008-01-10 14:10:59)  Azithromycin tablets What is this medicine? AZITHROMYCIN (az ith roe MYE sin) is a macrolide antibiotic. It is used to treat or prevent certain kinds of bacterial infections. It will not work for colds, flu, or other viral infections. This medicine may be used for other purposes; ask your health care provider or pharmacist if you have questions. COMMON BRAND NAME(S): Zithromax, Zithromax Tri-Pak, Zithromax Z-Pak What should I tell my health care provider before I take this medicine? They need to know if you have any of these conditions: -kidney disease -liver disease -irregular heartbeat or heart disease -an unusual or allergic reaction to azithromycin, erythromycin, other macrolide antibiotics, foods, dyes, or preservatives -pregnant or trying to get pregnant -breast-feeding How should I use this medicine? Take this medicine by mouth with a full glass of water. Follow the directions on the prescription label. The tablets can be taken with food or on an empty stomach. If the medicine upsets your stomach, take it with food. Take your medicine at regular intervals. Do not take your medicine more often than directed. Take all of your medicine as directed even if you think your are better. Do not skip doses or stop your medicine early. Talk to your pediatrician regarding the use of this medicine in children. Special care may be needed. Overdosage: If you think you have taken too much of this medicine contact a poison control center or emergency room at once. NOTE: This medicine is only for you. Do not share this  medicine with others. What if I miss a dose? If you miss a dose, take it as soon as you can. If it is almost time for your next dose, take only that dose. Do not take double or extra doses. What may interact with this medicine? Do not take this medicine with any of the following medications: -lincomycin This medicine may also interact with the following medications: -amiodarone -antacids -birth control pills -cyclosporine -digoxin -magnesium -nelfinavir -phenytoin -warfarin This list may not describe all possible interactions. Give your health care provider a list of all the medicines, herbs, non-prescription drugs, or dietary supplements you use. Also tell them if you smoke, drink alcohol, or use illegal drugs. Some items may interact with your medicine. What should I watch for while using this medicine? Tell your doctor or health care professional if your symptoms do not improve. Do not treat diarrhea with over the counter products. Contact your doctor if you have diarrhea that lasts more than 2 days or if  it is severe and watery. This medicine can make you more sensitive to the sun. Keep out of the sun. If you cannot avoid being in the sun, wear protective clothing and use sunscreen. Do not use sun lamps or tanning beds/booths. What side effects may I notice from receiving this medicine? Side effects that you should report to your doctor or health care professional as soon as possible: -allergic reactions like skin rash, itching or hives, swelling of the face, lips, or tongue -confusion, nightmares or hallucinations -dark urine -difficulty breathing -hearing loss -irregular heartbeat or chest pain -pain or difficulty passing urine -redness, blistering, peeling or loosening of the skin, including inside the mouth -white patches or sores in the mouth -yellowing of the eyes or skin Side effects that usually do not require medical attention (report to your doctor or health care  professional if they continue or are bothersome): -diarrhea -dizziness, drowsiness -headache -stomach upset or vomiting -tooth discoloration -vaginal irritation This list may not describe all possible side effects. Call your doctor for medical advice about side effects. You may report side effects to FDA at 1-800-FDA-1088. Where should I keep my medicine? Keep out of the reach of children. Store at room temperature between 15 and 30 degrees C (59 and 86 degrees F). Throw away any unused medicine after the expiration date. NOTE: This sheet is a summary. It may not cover all possible information. If you have questions about this medicine, talk to your doctor, pharmacist, or health care provider.  2015, [employer]. (2013-05-25 15:38:48)

## 2014-11-08 NOTE — Telephone Encounter (Signed)
Patient states she was discharged from the hospital, diagnosed with bacterial pneumonia and was told to be seen by her PCP on Monday or Tuesday of next week.  Patient states she can only come in the morning and it has to be Monday or Tuesday next per her discharge paperwork.  Please advise where I can schedule the patient or if she can see a different provider.

## 2014-11-08 NOTE — ED Notes (Signed)
Pt is from [facility]. Per EMS, pt reports sore throat and fever x3 days, and a persistent, dry cough since Wednesday. Pt with hx of pneumonia 3 years ago, and states that this feels similar. Per EMS, pt tachy at 112.

## 2014-11-08 NOTE — ED Provider Notes (Signed)
CSN: [id number]     Arrival date & time 11/08/14  0236 History  This chart was scribe for Delora Fuel, MD by Judithann Sauger, ED Scribe. The patient was seen in room A03C/A03C and the patient's care was started at 2:56 AM.    Chief Complaint  Patient presents with  . Shortness of Breath  . Cough   Patient is a 74 y.o. female presenting with cough. The history is provided by the patient. No language interpreter was used.  Cough Associated symptoms: chest pain, chills and sore throat    HPI Comments: Brandy Conway is a 74 y.o. female with a hx of pneumonia 3 years ago brought in by ambulance, who presents to the Emergency Department complaining of a gradually worsening non productive cough onset 3 days. She explains that her symptoms started as a sore throat. She reports associated CP, chills, and tachycardia. She states that when she sleeps, it is hard to breathe and when she breathes, it's as if she is gasping for air. She reports that she has had her flu shot and her "pneumonia shot". She denies smoking. She adds that these symptoms were similar to the last time she had   Past Medical History  Diagnosis Date  . Allergy   . Facial nerve sensory disorder   . History of skin cancer    Past Surgical History  Procedure Laterality Date  . Abdominal hysterectomy    . Tonsillectomy and adenoidectomy     Family History  Problem Relation Age of Onset  . Heart disease Mother     CHF  . Heart disease Brother 75    stents   History  Substance Use Topics  . Smoking status: Former Smoker -- 0.50 packs/day for 10 years    Types: Cigarettes    Quit date: 11/02/1973  . Smokeless tobacco: Not on file  . Alcohol Use: No   OB History    No data available     Review of Systems  Constitutional: Positive for chills.  HENT: Positive for sore throat.   Respiratory: Positive for cough.   Cardiovascular: Positive for chest pain.  All other systems reviewed and are negative.     Allergies   Review of patient's allergies indicates no known allergies.  Home Medications   Prior to Admission medications   Medication Sig Start Date End Date Taking? Authorizing Provider  aspirin 325 MG buffered tablet Take 325 mg by mouth as needed.    Historical Provider, MD   BP 131/56 mmHg  Temp(Src) 98 F (36.7 C) (Oral)  Resp 18  Ht 5\' 9"  (1.753 m)  Wt 125 lb (56.7 kg)  BMI 18.45 kg/m2  SpO2 100% Physical Exam  Constitutional: She is oriented to person, place, and time. She appears well-developed and well-nourished. No distress.  HENT:  Head: Normocephalic and atraumatic.  Eyes: Conjunctivae and EOM are normal. Pupils are equal, round, and reactive to light.  Neck: Normal range of motion. Neck supple. No JVD present.  Cardiovascular: Regular rhythm and normal heart sounds.   No murmur heard. Tachycardiac   Pulmonary/Chest: Effort normal. No respiratory distress. She has no wheezes. She has no rales.  Slightly prolonged exhalation phase   Abdominal: Soft. Bowel sounds are normal. She exhibits no distension and no mass. There is no tenderness.  Musculoskeletal: Normal range of motion. She exhibits no edema.  Lymphadenopathy:    She has no cervical adenopathy.  Neurological: She is alert and oriented to person, place, and time.  She has normal reflexes. No cranial nerve deficit. Coordination normal.  Skin: Skin is warm and dry. No rash noted.  Psychiatric: She has a normal mood and affect. Her behavior is normal. Judgment and thought content normal.  Nursing note and vitals reviewed.   ED Course  Procedures (including critical care time) DIAGNOSTIC STUDIES: Oxygen Saturation is 100% on RA, normal by my interpretation.    COORDINATION OF CARE: 3:04 AM- Pt advised of plan for treatment and pt agrees.    Labs Review Results for orders placed or performed during the hospital encounter of 51/88/41  Basic metabolic panel  Result Value Ref Range   Sodium 135 135 - 145 mmol/L    Potassium 3.5 3.5 - 5.1 mmol/L   Chloride 102 96 - 112 mEq/L   CO2 25 19 - 32 mmol/L   Glucose, Bld 140 (H) 70 - 99 mg/dL   BUN 10 6 - 23 mg/dL   Creatinine, Ser 0.49 (L) 0.50 - 1.10 mg/dL   Calcium 8.7 8.4 - 10.5 mg/dL   GFR calc non Af Amer >90 >90 mL/min   GFR calc Af Amer >90 >90 mL/min   Anion gap 8 5 - 15  BNP (order ONLY if patient complains of dyspnea/SOB AND you have documented it for THIS visit)  Result Value Ref Range   B Natriuretic Peptide 144.9 (H) 0.0 - 100.0 pg/mL  CBC with Differential  Result Value Ref Range   WBC 10.0 4.0 - 10.5 K/uL   RBC 3.99 3.87 - 5.11 MIL/uL   Hemoglobin 11.9 (L) 12.0 - 15.0 g/dL   HCT 36.8 36.0 - 46.0 %   MCV 92.2 78.0 - 100.0 fL   MCH 29.8 26.0 - 34.0 pg   MCHC 32.3 30.0 - 36.0 g/dL   RDW 12.5 11.5 - 15.5 %   Platelets 234 150 - 400 K/uL   Neutrophils Relative % 81 (H) 43 - 77 %   Neutro Abs 8.0 (H) 1.7 - 7.7 K/uL   Lymphocytes Relative 9 (L) 12 - 46 %   Lymphs Abs 0.9 0.7 - 4.0 K/uL   Monocytes Relative 10 3 - 12 %   Monocytes Absolute 1.0 0.1 - 1.0 K/uL   Eosinophils Relative 0 0 - 5 %   Eosinophils Absolute 0.0 0.0 - 0.7 K/uL   Basophils Relative 0 0 - 1 %   Basophils Absolute 0.0 0.0 - 0.1 K/uL  I-stat troponin, ED (not at [facility])  Result Value Ref Range   Troponin i, poc 0.00 0.00 - 0.08 ng/mL   Comment 3           Ct Angio Chest Pe W/cm &/or Wo Cm  11/08/2014   CLINICAL DATA:  Shortness of breath. Sore throat and fever for 3 days. Persistent dry cough since Wednesday. History of pneumonia 3 years ago.  EXAM: CT ANGIOGRAPHY CHEST WITH CONTRAST  TECHNIQUE: Multidetector CT imaging of the chest was performed using the standard protocol during bolus administration of intravenous contrast. Multiplanar CT image reconstructions and MIPs were obtained to evaluate the vascular anatomy.  CONTRAST:  32mL OMNIPAQUE IOHEXOL 350 MG/ML SOLN  COMPARISON:  None.  FINDINGS: Technically adequate study with good opacification of the central and  segmental pulmonary arteries. No focal filling defects demonstrated. No evidence of significant pulmonary embolus.  Normal heart size. Normal caliber thoracic aorta. No evidence of dissection. Great vessel origins are patent. Esophagus is decompressed. No significant lymphadenopathy in the chest.  Emphysematous changes in the lungs with scattered  fibrosis. Patchy foci of airspace disease demonstrated throughout both lungs. This is most likely to represent multifocal pneumonia although atypical pneumonia such as TB or fungal infection could also have this appearance. Mild peripheral bronchiectasis. Airways appear patent. No pleural effusion. No pneumothorax.  Included portions of the upper abdominal organs are grossly unremarkable. With mild degenerative changes in the thoracic spine.  Review of the MIP images confirms the above findings.  IMPRESSION: No evidence of significant pulmonary embolus. Multiple patchy areas of infiltration throughout both lungs likely to represent multifocal pneumonia.   Electronically Signed   By: Lucienne Capers M.D.   On: 11/08/2014 05:35   Dg Chest Port 1 View  11/08/2014   CLINICAL DATA:  Initial valuation for shortness of breath.  EXAM: PORTABLE CHEST - 1 VIEW  COMPARISON:  None available  FINDINGS: Cardiac and mediastinal silhouettes are within normal limits.  Lungs are hyperinflated with chronic changes compatible with COPD/ emphysema. Diffuse bronchitic changes present. No pulmonary edema or pleural effusion. There is a small focus of more patchy opacity within the right lung base, which may reflect atelectasis/ scarring versus small infiltrate. No other focal airspace disease.  No acute osseus abnormality.  IMPRESSION: 1. Hyperinflation with chronic lung changes, compatible with emphysema. 2. Small focus of patchy opacity within the peripheral right lung base, favored to reflect chronic lung changes/scarring, although possible small infectious infiltrate could be considered in  the correct clinical setting.   Electronically Signed   By: Jeannine Boga M.D.   On: 11/08/2014 03:20     Imaging Review Ct Angio Chest Pe W/cm &/or Wo Cm  11/08/2014   CLINICAL DATA:  Shortness of breath. Sore throat and fever for 3 days. Persistent dry cough since Wednesday. History of pneumonia 3 years ago.  EXAM: CT ANGIOGRAPHY CHEST WITH CONTRAST  TECHNIQUE: Multidetector CT imaging of the chest was performed using the standard protocol during bolus administration of intravenous contrast. Multiplanar CT image reconstructions and MIPs were obtained to evaluate the vascular anatomy.  CONTRAST:  36mL OMNIPAQUE IOHEXOL 350 MG/ML SOLN  COMPARISON:  None.  FINDINGS: Technically adequate study with good opacification of the central and segmental pulmonary arteries. No focal filling defects demonstrated. No evidence of significant pulmonary embolus.  Normal heart size. Normal caliber thoracic aorta. No evidence of dissection. Great vessel origins are patent. Esophagus is decompressed. No significant lymphadenopathy in the chest.  Emphysematous changes in the lungs with scattered fibrosis. Patchy foci of airspace disease demonstrated throughout both lungs. This is most likely to represent multifocal pneumonia although atypical pneumonia such as TB or fungal infection could also have this appearance. Mild peripheral bronchiectasis. Airways appear patent. No pleural effusion. No pneumothorax.  Included portions of the upper abdominal organs are grossly unremarkable. With mild degenerative changes in the thoracic spine.  Review of the MIP images confirms the above findings.  IMPRESSION: No evidence of significant pulmonary embolus. Multiple patchy areas of infiltration throughout both lungs likely to represent multifocal pneumonia.   Electronically Signed   By: Lucienne Capers M.D.   On: 11/08/2014 05:35   Dg Chest Port 1 View  11/08/2014   CLINICAL DATA:  Initial valuation for shortness of breath.  EXAM:  PORTABLE CHEST - 1 VIEW  COMPARISON:  None available  FINDINGS: Cardiac and mediastinal silhouettes are within normal limits.  Lungs are hyperinflated with chronic changes compatible with COPD/ emphysema. Diffuse bronchitic changes present. No pulmonary edema or pleural effusion. There is a small focus of more patchy opacity  within the right lung base, which may reflect atelectasis/ scarring versus small infiltrate. No other focal airspace disease.  No acute osseus abnormality.  IMPRESSION: 1. Hyperinflation with chronic lung changes, compatible with emphysema. 2. Small focus of patchy opacity within the peripheral right lung base, favored to reflect chronic lung changes/scarring, although possible small infectious infiltrate could be considered in the correct clinical setting.   Electronically Signed   By: Jeannine Boga M.D.   On: 11/08/2014 03:20     EKG Interpretation   Date/Time:  Thursday November 08 2014 02:45:41 EST Ventricular Rate:  107 PR Interval:  179 QRS Duration: 128 QT Interval:  369 QTC Calculation: 492 R Axis:   95 Text Interpretation:  Sinus tachycardia Multiple ventricular premature  complexes Biatrial enlargement Right bundle branch block When compared  with ECG of 02/19/2001, Right bundle branch block has replaced Incomplete  right bundle branch block Premature ventricular complexes are now Present  Confirmed by [facility]  MD, Edker Punt ([id number]) on 11/08/2014 2:53:46 AM      MDM   Final diagnoses:  Shortness of breath  Community acquired pneumonia    Dyspnea of uncertain etiology. There is a slightly prolonged exhalation phase which may represent bronchospasm. She'll be sent for chest x-ray to evaluate for possible pneumonia. She will be given a therapeutic trial of albuterol with ipratropium.  There was no improvement following albuterol with ipratropium. Chest x-ray did not show any definite pneumonia. She continued to be tachycardic so decision was made to send for CT  angiogram to rule out pulmonary embolism. CT angiogram showed evidence of multifocal pneumonia. In spite of this, she is nontoxic in appearance. She was maintaining good oxygen saturations on room air. Decision was made to attempt treatment as an outpatient. She is given IV hydration and given a dose of ceftriaxone and azithromycin in the ED and sent home with prescriptions for amoxicillin and azithromycin.  I personally performed the services described in this documentation, which was scribed in my presence. The recorded information has been reviewed and is accurate.    Delora Fuel, MD 55/73/22 [id number]

## 2014-11-08 NOTE — Telephone Encounter (Signed)
See below

## 2014-11-09 NOTE — Telephone Encounter (Signed)
Brandy Conway see below

## 2014-11-09 NOTE — Telephone Encounter (Signed)
done

## 2014-11-14 ENCOUNTER — Ambulatory Visit (INDEPENDENT_AMBULATORY_CARE_PROVIDER_SITE_OTHER): Payer: Medicare Other | Admitting: Family Medicine

## 2014-11-14 ENCOUNTER — Encounter: Payer: Self-pay | Admitting: Family Medicine

## 2014-11-14 VITALS — BP 100/70 | HR 92 | Temp 97.6°F | Wt 123.0 lb

## 2014-11-14 DIAGNOSIS — J189 Pneumonia, unspecified organism: Secondary | ICD-10-CM

## 2014-11-14 DIAGNOSIS — G500 Trigeminal neuralgia: Secondary | ICD-10-CM | POA: Diagnosis not present

## 2014-11-14 NOTE — Assessment & Plan Note (Signed)
Offered neurology referral today but patient declines and will call when she is feeling better from pneumonia for referral.

## 2014-11-14 NOTE — Progress Notes (Signed)
  [name], MD Phone: [phone]  Subjective:   Brandy Conway is a 74 y.o. year old very pleasant female patient who presents with the following:  Community Acquired pneumonia Seen in ED on 11/08/14. Brought by ambulance for gradually worsening nonproductive cough, sore throat, chest pain, chills, and tachycardia, and difficulty breathing at times. CXR was negative and given tachycardia, CTA ordered which did not show PE but showed multiple patchy areas of infiltrate likely multifocal pneumonia. She was trialed on albuterol and ipratropium before CT as CXR suggestive of emphysema (only 5 pack years) but this did not help. She ws given IV hydration and dose of ceftriazone and azithromycin in ED then sent out with amoxicillin and azithromycin. Typical 5 day z-pack and amoxicillin x 10 days. Since she lives alone, she was somewhat frustratd she was sent home.   Patient compliant with regimen. Her breathing issues have resolved with no shortness of breath, coughing with laying down. She does have some slight upstet stomach and feels ome swollen with some diarrhea on the amoxicillin but it is tolerable.   ROS-no edema, fever, chills. No chest pain since going to ED.   Trigeminal neuralgia Seems to be worse in cold weather and with her current cough. She is not interested in medications at present but states when she is feeling better from lungs would consider neurology evaluation ROS- facial pain sever noted in spasms. No hearing loss or rash.   Past Medical History- Patient Active Problem List   Diagnosis Date Noted  . Hyperlipidemia 09/24/2014    Priority: Medium  . Trigeminal neuralgia 09/24/2014    Priority: Low  . History of skin cancer    Medications- reviewed and updated Current Outpatient Prescriptions  Medication Sig Dispense Refill  . amoxicillin (AMOXIL) 500 MG capsule Take 2 capsules (1,000 mg total) by mouth 2 (two) times daily. 40 capsule 0  . aspirin 325 MG buffered tablet  Take 325 mg by mouth as needed.    Marland Kitchen azithromycin (ZITHROMAX) 250 MG tablet Take 1 tablet (250 mg total) by mouth daily. Start on Friday, 1/8 4 tablet 0   No current facility-administered medications for this visit.    Objective: BP 100/70 mmHg  Pulse 92  Temp(Src) 97.6 F (36.4 C)  Wt 123 lb (55.792 kg)  SpO2 98% Gen: NAD, holds left side of face intermittently and cringes in pain CV: RRR no murmurs rubs or gallops Lungs: CTAB no crackles, wheeze, rhonchi Abdomen: slightly distended/soft/normal bowel sounds. No rebound or guarding.  Ext: no edema, legs slightly painful with varicose veins but no calf tenderness Skin: warm, dry, no rash   Assessment/Plan: Community Acquired pneumonia Improving on completed z-pack and amoxicillin x 10 days. Patient to complete course. i advised her to follow up next Monday for repeat evaluation but she declines.   Trigeminal neuralgia Offered neurology referral today but patient declines and will call when she is feeling better from pneumonia for referral.    Return precautions advised.

## 2014-11-14 NOTE — Patient Instructions (Signed)
I am glad you are doing better from the pneumonia. Please finish the amoxicillin.   I would like to see you next week to check in.   Please call me when you are feeling better and we can send you to neurology to discuss options for your facial nerve pain.

## 2015-02-12 ENCOUNTER — Telehealth: Payer: Self-pay | Admitting: Adult Health

## 2015-02-12 ENCOUNTER — Ambulatory Visit (INDEPENDENT_AMBULATORY_CARE_PROVIDER_SITE_OTHER): Payer: Medicare Other | Admitting: Adult Health

## 2015-02-12 ENCOUNTER — Ambulatory Visit (INDEPENDENT_AMBULATORY_CARE_PROVIDER_SITE_OTHER)
Admission: RE | Admit: 2015-02-12 | Discharge: 2015-02-12 | Disposition: A | Discharge status: Home / Self Care | Payer: Medicare Other | Source: Ambulatory Visit | Attending: Adult Health | Admitting: Adult Health

## 2015-02-12 ENCOUNTER — Encounter: Payer: Self-pay | Admitting: Adult Health

## 2015-02-12 VITALS — BP 92/60 | HR 102 | Temp 98.0°F | Wt 121.5 lb

## 2015-02-12 DIAGNOSIS — R05 Cough: Secondary | ICD-10-CM

## 2015-02-12 DIAGNOSIS — R002 Palpitations: Secondary | ICD-10-CM

## 2015-02-12 DIAGNOSIS — R059 Cough, unspecified: Secondary | ICD-10-CM

## 2015-02-12 DIAGNOSIS — R0602 Shortness of breath: Secondary | ICD-10-CM | POA: Diagnosis not present

## 2015-02-12 LAB — CBC WITH DIFFERENTIAL/PLATELET
BASOS ABS: 0 10*3/uL (ref 0.0–0.1)
Basophils Relative: 0.2 % (ref 0.0–3.0)
Eosinophils Absolute: 0.2 10*3/uL (ref 0.0–0.7)
Eosinophils Relative: 2.2 % (ref 0.0–5.0)
HCT: 36.8 % (ref 36.0–46.0)
HEMOGLOBIN: 12.1 g/dL (ref 12.0–15.0)
LYMPHS ABS: 1 10*3/uL (ref 0.7–4.0)
Lymphocytes Relative: 10.8 % — ABNORMAL LOW (ref 12.0–46.0)
MCHC: 32.9 g/dL (ref 30.0–36.0)
MCV: 90.5 fl (ref 78.0–100.0)
MONOS PCT: 8.8 % (ref 3.0–12.0)
Monocytes Absolute: 0.8 10*3/uL (ref 0.1–1.0)
NEUTROS ABS: 7.5 10*3/uL (ref 1.4–7.7)
Neutrophils Relative %: 78 % — ABNORMAL HIGH (ref 43.0–77.0)
Platelets: 390 10*3/uL (ref 150.0–400.0)
RBC: 4.07 Mil/uL (ref 3.87–5.11)
RDW: 13.5 % (ref 11.5–15.5)
WBC: 9.7 10*3/uL (ref 4.0–10.5)

## 2015-02-12 MED ORDER — LEVOFLOXACIN 500 MG PO TABS: 500.0000 mg | ORAL_TABLET | Freq: Every day | ORAL | Status: DC

## 2015-02-12 NOTE — Telephone Encounter (Signed)
Spoke with her PCP MD Yong Channel. He is ok with treating patient for PNA with Levaquin and then on their next follow up he will speak to her about follow up with Pulm

## 2015-02-12 NOTE — Patient Instructions (Signed)
I am worried that you may have the beginnings of pneumonia again. We will get some blood work and a chest x ray to help [place] determine this. Once I get all the information back in the office today, I will give you a call and let you know what the rest of the plan is. I hope you feel better. If yo need anything in the mean time, please let me know.

## 2015-02-12 NOTE — Telephone Encounter (Signed)
Called and spoke with pt and pt is aware.  

## 2015-02-12 NOTE — Progress Notes (Signed)
   Subjective:    Patient ID: Brandy Conway, female    DOB: [birth year], 74 y.o.   MRN: [id number]  HPI  Ms. Woehler presents to the office today with cough and SOB with low grade fever. She endorses that she was seen in the ER in January and was diagnosed with PNA. Her clinical presentation today is almost exactly what she presented with in the ER - per patient.    She took her prescribed antibiotics and felt better but over the last two weeks she has had more difficulty with breathing especially while laying down. Also with SOB while climbing stairs. She has a cough that is productive at times with yellow mucus. + back pain with coughing. Denies any UTI type symptoms, nausea, vomiting, diarrhea.   Secondary, she also complains of occassional palpitations while she is laying down and when she has trouble breathing.    Review of Systems  Constitutional: Positive for fever. Negative for chills, activity change, appetite change and fatigue.  HENT: Positive for ear pain, hearing loss and sore throat. Negative for facial swelling, rhinorrhea, sinus pressure, trouble swallowing and voice change.   Eyes: Negative for pain, discharge and itching.  Respiratory: Positive for cough and shortness of breath. Negative for apnea and chest tightness.   Cardiovascular: Positive for palpitations. Negative for chest pain and leg swelling.  Gastrointestinal: Negative for nausea, abdominal pain, diarrhea and constipation.  Genitourinary: Negative.   Musculoskeletal: Positive for back pain. Negative for myalgias and neck pain.        Objective:   Physical Exam  Constitutional: She is oriented to person, place, and time. She appears well-developed. No distress.  Frail and thin  HENT:  Head: Normocephalic and atraumatic.  Right Ear: External ear normal.  Left Ear: External ear normal.  Nose: Nose normal.  Mouth/Throat: Oropharynx is clear and moist. No oropharyngeal exudate.  Eyes: Pupils are equal, round, and  reactive to light. Right eye exhibits no discharge. Left eye exhibits no discharge.  Cardiovascular:  Sinus tach with RBBB and frequent multiform ectopic ventricular beats  Pulmonary/Chest: Effort normal. No respiratory distress. She has no wheezes. She has no rales.  diminished at bases  Lymphadenopathy:    She has no cervical adenopathy.  Neurological: She is alert and oriented to person, place, and time.  Skin: Skin is warm and dry. She is not diaphoretic.  Psychiatric: She has a normal mood and affect. Her behavior is normal.       Assessment & Plan:  Pneumonia - Chest x ray shows: Right middle and lower lobe airspace disease most consistent with pneumonia. Recommend followup to clearing.  1.4 cm nodular opacity projecting in the right mid lung zone may be related to the patient's pneumonia but attention is recommended on follow-up exams. - PCP aware of finding and will follow up with patient about possible Pulmonology consult - Prescribed Levaquin 500mg  Daily for 7 days. - Advised to follow up if not feeling better in 2-3 days. If her symptoms worsen she was advised to go to the ER.    RBBB with ectopic ventricular beats - No change in previous EKG - She is not experiencing chest pain or palpations at this time.  -Spoke to her PCP MD [facility] about cardiology consult. He will evaluate her next month during her office visit.  - Advised to follow up in ER if she experiences any chest pain or continued palpitations.

## 2015-02-12 NOTE — Progress Notes (Signed)
Pre visit review using our clinic review tool, if applicable. No additional management support is needed unless otherwise documented below in the visit note. 

## 2015-02-12 NOTE — Telephone Encounter (Signed)
Please call patient and inform her that her chest xray showed that she still has pneumonia. I have sent a prescription for an antibiotic called Levaquin to her pharmacy. She is to take one pill every day for seven days. She can follow up in 2-3 days if not feeling better. If her symptoms worsen she needs to go to the ER.

## 2015-02-12 NOTE — Telephone Encounter (Signed)
Left a message for return call.  

## 2015-03-15 ENCOUNTER — Ambulatory Visit (INDEPENDENT_AMBULATORY_CARE_PROVIDER_SITE_OTHER): Payer: Medicare Other | Admitting: Family Medicine

## 2015-03-15 ENCOUNTER — Ambulatory Visit (INDEPENDENT_AMBULATORY_CARE_PROVIDER_SITE_OTHER)
Admission: RE | Admit: 2015-03-15 | Discharge: 2015-03-15 | Disposition: A | Discharge status: Home / Self Care | Payer: Medicare Other | Source: Ambulatory Visit | Attending: Family Medicine | Admitting: Family Medicine

## 2015-03-15 ENCOUNTER — Encounter: Payer: Self-pay | Admitting: Family Medicine

## 2015-03-15 VITALS — BP 112/74 | HR 95 | Temp 98.1°F | Wt 121.0 lb

## 2015-03-15 DIAGNOSIS — J189 Pneumonia, unspecified organism: Secondary | ICD-10-CM

## 2015-03-15 DIAGNOSIS — Z0000 Encounter for general adult medical examination without abnormal findings: Secondary | ICD-10-CM | POA: Diagnosis not present

## 2015-03-15 DIAGNOSIS — R911 Solitary pulmonary nodule: Secondary | ICD-10-CM | POA: Diagnosis not present

## 2015-03-15 DIAGNOSIS — G500 Trigeminal neuralgia: Secondary | ICD-10-CM

## 2015-03-15 DIAGNOSIS — J984 Other disorders of lung: Secondary | ICD-10-CM | POA: Diagnosis not present

## 2015-03-15 DIAGNOSIS — J181 Lobar pneumonia, unspecified organism: Secondary | ICD-10-CM | POA: Diagnosis not present

## 2015-03-15 HISTORY — DX: Pneumonia, unspecified organism: J18.9

## 2015-03-15 MED ORDER — CARBAMAZEPINE ER 100 MG PO CP12: 100.0000 mg | ORAL_CAPSULE | Freq: Two times a day (BID) | ORAL | Status: DC

## 2015-03-15 NOTE — Progress Notes (Signed)
Brandy Reddish, MD Phone: [phone]  Subjective:  Patient presents today for their annual wellness visit in addition to problem oriented vsiit for trigeminal neuralgia   Preventive Screening-Counseling & Management  Smoking Status: Former Smoker quit 1980, 8 pack years Second Hand Smoking status: smokers in home, yes. Advised patient to have them smoke outside 1 alcoholic beverage per night  Risk Factors Regular exercise: 7 days a week walk Nu steps class exercise for 60 minutes Diet: reasonable  Fall Risk: None   Cardiac risk factors:  advanced age (older than 63 for men, 14 for women) yes Hyperlipidemia: mild elevations not on statin for primary prevention. 10 year risk 8%. Using 12% cut off per recent [facility] guidelines above the 7.5% usptf cut off No diabetes.  Family History: brother with stents at age 67   Depression Screen None. PHQ2 1. This is due to pain in her face from trigeminal neuralgia  Activities of Daily Living Independent ADLs and IADLs   Hearing Difficulties: tinnitus, difficulty with whispering  Cognitive Testing No reported trouble.   Normal 3 word recall  List the Names of Other Physician/Practitioners you currently use: 1.Dermatology- [facility] center last seen 2015 2. Optho- sees yearly, does not recall name  Hospitalized for Pneumonia in January 2016 Skin surgery within the year  Immunization History  Administered Date(s) Administered  . Influenza Whole 08/08/2007, 07/16/2010  . Influenza,inj,Quad PF,36+ Mos 07/19/2013  . Influenza-Unspecified 08/11/2014  . Pneumococcal Conjugate-13 09/24/2014  . Pneumococcal-Unspecified 09/02/2006   Required Immunizations needed today Needs Tdap but not covered by insurance, to check with insurance about shingles  Screening tests- up to date  Advice: consider repeat hearing eval (patietn declined, avoid being aroudn smokers in her complex as much as possible, not clear that this is  depression but instead related to trigeminal neuralgia-be more aggressive in care.   ROS- No pertinent positives discovered in course of AWV except as noted in subjective portion under problem oriented visit.   The following were reviewed and entered/updated in epic: Past Medical History  Diagnosis Date  . Allergy   . Facial nerve sensory disorder   . History of skin cancer    Patient Active Problem List   Diagnosis Date Noted  . CAP (community acquired pneumonia) 03/15/2015    Priority: Medium  . Hyperlipidemia 09/24/2014    Priority: Medium  . Trigeminal neuralgia 09/24/2014    Priority: Medium  . History of skin cancer     Priority: Low   Past Surgical History  Procedure Laterality Date  . Abdominal hysterectomy    . Tonsillectomy and adenoidectomy      Family History  Problem Relation Age of Onset  . Heart disease Mother     CHF  . Heart disease Brother 75    stents    Medications- reviewed and updated Current Outpatient Prescriptions  Medication Sig Dispense Refill  . aspirin 325 MG buffered tablet Take 325 mg by mouth as needed.     No current facility-administered medications for this visit.    Allergies-reviewed and updated No Known Allergies  History   Social History  . Marital Status: Married    Spouse Name: N/A  . Number of Children: N/A  . Years of Education: N/A   Social History Main Topics  . Smoking status: Former Smoker -- 0.50 packs/day for 16 years    Types: Cigarettes    Quit date: 11/02/1978  . Smokeless tobacco: Not on file  . Alcohol Use: 4.2 oz/week  7 Standard drinks or equivalent per week  . Drug Use: No  . Sexual Activity: No   Other Topics Concern  . None   Social History Narrative   Widowed in March 2015-sick for a long time. 1 son- lost at age 26.5 sudden death-malignant brain tumor ruptured. No pets.       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 PERRLA.  CV: RRR no mrg  Lungs: CTAB  Abd: soft/nontender/nondistended/normal bowel sounds  MSK: moves all extremities, no edema  Skin: warm and dry, no rash  Neuro: CN II-XII intact, sensation and reflexes normal throughout, 5/5 muscle strength in bilateral upper and lower extremities. Normal finger to nose. Normal rapid alternating movements. Normal gait.   Assessment/Plan:  Trigeminal neuralgia S: symptoms since 2005. No treatments or investigations per Dr. Arnoldo Morale. Patient not requesting further workup. I had offered neurology referral at past visits due to severe episodes of pain but patient has declined. Has not had neuroimaging. She has had nearly 2 weeks of intermittent flares in her pain. Has a hard time thinking or concentrating when they occur.  Ros- no facial weakness, no facial numbness, no extremity weakness or slurred speech A/P: We will trial carbamazepine 100mg  BID and follow up in 2 weeks. She can cancel this is has neuro appointment by then as I also referred her to neurology and stressed I really want her to follow through with this.      CAP (community acquired pneumonia) S: 2 cases of pneumonia this year. One was multifocal and treated in ER. One cared for about a month ago in our office. There was a nodular opacity on last cxr. ROS- no chest pain or shortness of breath.  A/P: Chest x-ray completed today 1 month after pneumonia shows mild residual in middle lung. Patient does have risk factors for malignancy as former smoker 8 pack years quitting 1980 but also had a CT in January in ED which did not show malignancy. Strongly considered  pulmonary consult but will hold off for now with plan for 1 final chest x-ray within 4-8 and if not cleared at that time, would refer. Also would refer if another case of pneumonia this year.     2 week follow up. 2 month chest x-ray performed.   Orders Placed This Encounter  Procedures  . DG Chest 2 View    Standing Status: Future     Number of Occurrences: 1     Standing Expiration Date: 05/14/2016    Order Specific Question:  Reason for Exam (SYMPTOM  OR DIAGNOSIS REQUIRED)    Answer:  follow up pneumonia and nodular opacity    Order Specific Question:  Preferred imaging location?    Answer:  [facility]  . Ambulatory referral to Neurology    Referral Priority:  Routine    Referral Type:  Consultation    Referral Reason:  Specialty Services Required    Requested Specialty:  Neurology    Number of Visits Requested:  1    Meds ordered this encounter  Medications  . carbamazepine (  CARBATROL) 100 MG 12 hr capsule    Sig: Take 1 capsule (100 mg total) by mouth 2 (two) times daily.    Dispense:  60 capsule    Refill:  2

## 2015-03-15 NOTE — Patient Instructions (Addendum)
Contact insurance regarding shingles shot.  Get a chest x-ray within a week. If pneumonia and related findings are gone, no further follow up. If any persistent areas, refer to pulmonology given recurrent pneumonia  For trigeminal neuralgia-refer to neurology. Start carbamazepine 100 mg twice a day. Can cause dizziness or drowsiness. Let's check in 2 weeks from now to see how it is doing unless your neurology appointment is that soon and you can simply see them in that case.

## 2015-03-15 NOTE — Assessment & Plan Note (Signed)
S: symptoms since 2005. No treatments or investigations per Dr. Arnoldo Morale. Patient not requesting further workup. I had offered neurology referral at past visits due to severe episodes of pain but patient has declined. Has not had neuroimaging. She has had nearly 2 weeks of intermittent flares in her pain. Has a hard time thinking or concentrating when they occur.  Ros- no facial weakness, no facial numbness, no extremity weakness or slurred speech A/P: We will trial carbamazepine 100mg  BID and follow up in 2 weeks. She can cancel this is has neuro appointment by then as I also referred her to neurology and stressed I really want her to follow through with this.

## 2015-03-15 NOTE — Assessment & Plan Note (Addendum)
S: 2 cases of pneumonia this year. One was multifocal and treated in ER. One cared for about a month ago in our office. There was a nodular opacity on last cxr. ROS- no chest pain or shortness of breath.  A/P: Chest x-ray completed today 1 month after pneumonia shows mild residual in middle lung. Patient does have risk factors for malignancy as former smoker 8 pack years quitting 1980 but also had a CT in January in ED which did not show malignancy. Strongly considered pulmonary consult but will hold off for now with plan for 1 final chest x-ray within 4-8 and if not cleared at that time, would refer. Also would refer if another case of pneumonia this year.

## 2015-03-19 ENCOUNTER — Other Ambulatory Visit: Payer: Self-pay | Admitting: *Deleted

## 2015-03-19 MED ORDER — CARBAMAZEPINE ER 100 MG PO TB12: 100.0000 mg | ORAL_TABLET | Freq: Two times a day (BID) | ORAL | Status: DC

## 2015-04-02 ENCOUNTER — Ambulatory Visit: Payer: Medicare Other | Admitting: Family Medicine

## 2015-04-10 ENCOUNTER — Encounter: Payer: Self-pay | Admitting: Family Medicine

## 2015-04-10 ENCOUNTER — Ambulatory Visit (INDEPENDENT_AMBULATORY_CARE_PROVIDER_SITE_OTHER)
Admission: RE | Admit: 2015-04-10 | Discharge: 2015-04-10 | Disposition: A | Discharge status: Home / Self Care | Payer: Medicare Other | Source: Ambulatory Visit | Attending: Family Medicine | Admitting: Family Medicine

## 2015-04-10 ENCOUNTER — Ambulatory Visit (INDEPENDENT_AMBULATORY_CARE_PROVIDER_SITE_OTHER): Payer: Medicare Other | Admitting: Family Medicine

## 2015-04-10 VITALS — BP 124/70 | HR 64 | Temp 98.8°F | Resp 20 | Wt 121.0 lb

## 2015-04-10 DIAGNOSIS — R0602 Shortness of breath: Secondary | ICD-10-CM | POA: Insufficient documentation

## 2015-04-10 DIAGNOSIS — R079 Chest pain, unspecified: Secondary | ICD-10-CM | POA: Diagnosis not present

## 2015-04-10 DIAGNOSIS — J189 Pneumonia, unspecified organism: Secondary | ICD-10-CM

## 2015-04-10 HISTORY — DX: Shortness of breath: R06.02

## 2015-04-10 NOTE — Patient Instructions (Signed)
We will call you within a week about your referral to pulmonology. If you do not hear within 2 weeks, give [place] a call.   Get chest x-ray  If you developed worsening chest pain or shortness of breath, need to seek care immediately

## 2015-04-10 NOTE — Assessment & Plan Note (Signed)
Cleared on x-ray today

## 2015-04-10 NOTE — Progress Notes (Signed)
[name], MD  Subjective:  Brandy Conway is a 74 y.o. year old very pleasant female patient who presents with:  Shortness of breath Has never really felt fully recovered after pneumonia after Christmas as seems to remain fatigued.   Last week while climbing steps noted new shortness of breath. Can occur anytime though it is reliably reproduced by walking up stairs. Activity around the house doesn't always cause it but can occur just sitting around as well. Not worse with lying flat and does not wake up gasping for air. Denies cough, congestion, wheeze.  Patient honestly has a hard time recalling details about when these symptoms occur but is clear about it occuring with going up steps. Resting does improve shortness of breath  Some epigastric pain and sometime into left upper chest and some below left breast at times but this has been going on for years.  Always stops within a few minutes, just stops on its own. Tries to sit and relax and take deep breaths and that seems to help whether chest pain. Does not always get short of breath with this and does not describe the chest pain with climbint the stairs. Does think   States has had on and off chest pain for years though and not more frequent. States evaluated by cardiology in past and has had PVCs leading to palpitations which she experiences intermittently. She thinks it has been >10 years since she saw cardiology.    ROS- throat scratchy, no fever/chills. Patient denies  orthopnea, PND, recent immobilization, history DVT, active cancer, unilateral calf swelling, superficial veins present, swelling of entire leg, localized tenderness in calf  Past Medical History- Recurrent pneumonia in January and April 2016, trigeminal neuralgia- has not tried carbamazepine yet due to concern for arhythmia   Medications- reviewed and updated Current Outpatient Prescriptions  Medication Sig Dispense Refill  . aspirin 325 MG buffered tablet Take 325 mg by  mouth as needed.    . carbamazepine (TEGRETOL-XR) 100 MG 12 hr tablet Take 1 tablet (100 mg total) by mouth 2 (two) times daily. 60 tablet 2   Objective: BP 124/70 mmHg  Pulse 64  Temp(Src) 98.8 F (37.1 C) (Oral)  Resp 20  Wt 121 lb (54.885 kg)  SpO2 96% Gen: NAD, resting comfortably CV: RRR no murmurs rubs or gallops Lungs: CTAB no crackles, wheeze, rhonchi Abdomen: soft/nontender/nondistended/normal bowel sounds. No rebound or guarding.  Ext: no edema Skin: warm, dry, no rash over back or chest   EKG: sinus rhythm, rate 93, right axis, PR borderline just at 200, qt normal, qrs prolonged, right bundle banch block, no st or t wave changes. No changes from prior EKG in 4/12 and 1/7 of this year.   Assessment/Plan:  CAP (community acquired pneumonia) Cleared on x-ray today  Shortness of breath With history of recurrent pneumonia x2 this year and last PNA taking sometime to resolve radio graphically, originally planned on pulmonology referral. CXR today came back without pneumonia. Patient is having exertional dyspnea and has a history of seeing cardiology in past reportedly for PVCs. Considered starting with echocardiogram (SOB and right bundle branch block) and stress test (DOE), but ultimately decided to get her into cards to consider these as well as cardiac monitoring to see if her periods of shortness of breath could be arhythmia related if initial stress testing normal. She is in agreement and agrees to not overexert herself until evaluation.    Orders Placed This Encounter  Procedures  . DG Chest  2 View    Standing Status: Future     Number of Occurrences: 1     Standing Expiration Date: 06/09/2016    Order Specific Question:  Reason for Exam (SYMPTOM  OR DIAGNOSIS REQUIRED)    Answer:  pneumonia clearing follow up, new shortness of breath    Order Specific Question:  Preferred imaging location?    Answer:  [facility]  . Ambulatory referral to Cardiology    Referral  Priority:  Routine    Referral Type:  Consultation    Referral Reason:  Specialty Services Required    Requested Specialty:  Cardiology    Number of Visits Requested:  1  . EKG 12-Lead

## 2015-04-10 NOTE — Assessment & Plan Note (Signed)
With history of recurrent pneumonia x2 this year and last PNA taking sometime to resolve radio graphically, originally planned on pulmonology referral. CXR today came back without pneumonia. Patient is having exertional dyspnea and has a history of seeing cardiology in past reportedly for PVCs. Considered starting with echocardiogram (SOB and right bundle branch block) and stress test (DOE), but ultimately decided to get her into cards to consider these as well as cardiac monitoring to see if her periods of shortness of breath could be arhythmia related if initial stress testing normal. She is in agreement and agrees to not overexert herself until evaluation.

## 2015-04-15 ENCOUNTER — Emergency Department (HOSPITAL_COMMUNITY)
Admission: EM | Admit: 2015-04-15 | Discharge: 2015-04-15 | Disposition: A | Discharge status: Home / Self Care | Payer: Medicare Other | Attending: Emergency Medicine | Admitting: Emergency Medicine

## 2015-04-15 ENCOUNTER — Encounter (HOSPITAL_COMMUNITY): Payer: Self-pay | Admitting: Neurology

## 2015-04-15 ENCOUNTER — Emergency Department (HOSPITAL_COMMUNITY): Payer: Medicare Other

## 2015-04-15 DIAGNOSIS — R079 Chest pain, unspecified: Secondary | ICD-10-CM | POA: Insufficient documentation

## 2015-04-15 DIAGNOSIS — Z8669 Personal history of other diseases of the nervous system and sense organs: Secondary | ICD-10-CM | POA: Diagnosis not present

## 2015-04-15 DIAGNOSIS — R0602 Shortness of breath: Secondary | ICD-10-CM | POA: Diagnosis not present

## 2015-04-15 DIAGNOSIS — R1030 Lower abdominal pain, unspecified: Secondary | ICD-10-CM | POA: Insufficient documentation

## 2015-04-15 DIAGNOSIS — Z87891 Personal history of nicotine dependence: Secondary | ICD-10-CM | POA: Diagnosis not present

## 2015-04-15 DIAGNOSIS — Z85828 Personal history of other malignant neoplasm of skin: Secondary | ICD-10-CM | POA: Insufficient documentation

## 2015-04-15 DIAGNOSIS — Z79899 Other long term (current) drug therapy: Secondary | ICD-10-CM | POA: Diagnosis not present

## 2015-04-15 LAB — BASIC METABOLIC PANEL
ANION GAP: 8 (ref 5–15)
BUN: 13 mg/dL (ref 6–20)
CALCIUM: 9.2 mg/dL (ref 8.9–10.3)
CHLORIDE: 107 mmol/L (ref 101–111)
CO2: 24 mmol/L (ref 22–32)
Creatinine, Ser: 0.5 mg/dL (ref 0.44–1.00)
GFR calc Af Amer: >60 mL/min (ref >60)
GFR calc non Af Amer: >60 mL/min (ref >60)
Glucose, Bld: 98 mg/dL (ref 65–99)
POTASSIUM: 3.9 mmol/L (ref 3.5–5.1)
SODIUM: 139 mmol/L (ref 135–145)

## 2015-04-15 LAB — CBC
HEMATOCRIT: 40.3 % (ref 36.0–46.0)
Hemoglobin: 12.8 g/dL (ref 12.0–15.0)
MCH: 29.2 pg (ref 26.0–34.0)
MCHC: 31.8 g/dL (ref 30.0–36.0)
MCV: 92 fL (ref 78.0–100.0)
Platelets: 205 10*3/uL (ref 150–400)
RBC: 4.38 MIL/uL (ref 3.87–5.11)
RDW: 13.4 % (ref 11.5–15.5)
WBC: 3.9 10*3/uL — ABNORMAL LOW (ref 4.0–10.5)

## 2015-04-15 LAB — I-STAT TROPONIN, ED
TROPONIN I, POC: 0.01 ng/mL (ref 0.00–0.08)
TROPONIN I, POC: 0 ng/mL (ref 0.00–0.08)

## 2015-04-15 NOTE — ED Provider Notes (Signed)
CSN: [id number]     Arrival date & time 04/15/15  1127 History   First MD Initiated Contact with Patient 04/15/15 1146     Chief Complaint  Patient presents with  . Chest Pain     (Consider location/radiation/quality/duration/timing/severity/associated sxs/prior Treatment) HPI  Pt presenting with c/o chest pain.  Pain has been going on for the past week. Pain is located under left breast.  Not associated with exertion.  No shortness of breath.  She also c/o lower abdominal cramping but associates this with a change in the food she has been eating.  No fever/chills.  No cough.  No dysuria no hx of DVT/PE, no recent travel/trauma/surgery.  She has no hx of cardiac issues.  She was seen by her PMD last week for these symptoms and was referred to go see cardiology- she has appointment scheduled in approx 2 months.  No leg swelling.  There are no other associated systemic symptoms, there are no other alleviating or modifying factors.   Past Medical History  Diagnosis Date  . Allergy   . Facial nerve sensory disorder   . History of skin cancer    Past Surgical History  Procedure Laterality Date  . Abdominal hysterectomy    . Tonsillectomy and adenoidectomy     Family History  Problem Relation Age of Onset  . Heart disease Mother     CHF  . Heart disease Brother 75    stents   History  Substance Use Topics  . Smoking status: Former Smoker -- 0.50 packs/day for 16 years    Types: Cigarettes    Quit date: 11/02/1978  . Smokeless tobacco: Not on file  . Alcohol Use: 4.2 oz/week    7 Standard drinks or equivalent per week   OB History    No data available     Review of Systems  ROS reviewed and all otherwise negative except for mentioned in HPI    Allergies  Review of patient's allergies indicates no known allergies.  Home Medications   Prior to Admission medications   Medication Sig Start Date End Date Taking? Authorizing Provider  aspirin 325 MG buffered tablet Take 325  mg by mouth as needed.    Historical Provider, MD  carbamazepine (TEGRETOL-XR) 100 MG 12 hr tablet Take 1 tablet (100 mg total) by mouth 2 (two) times daily. 03/19/15   Marin Olp, MD   BP 130/58 mmHg  Pulse 74  Temp(Src) 98.1 F (36.7 C) (Oral)  Resp 16  SpO2 100%  Vitals reviewed Physical Exam  Physical Examination: General appearance - alert, well appearing, and in no distress Mental status - alert, oriented to person, place, and time Eyes - no conjunctival injection, no scleral icterus Mouth - mucous membranes moist, pharynx normal without lesions Chest - clear to auscultation, no wheezes, rales or rhonchi, symmetric air entry Heart - normal rate, regular rhythm, normal S1, S2, no murmurs, rubs, clicks or gallops Abdomen - soft, nontender, nondistended, no masses or organomegaly Neurological - alert  Extremities - peripheral pulses normal, no pedal edema, no clubbing or cyanosis Skin - normal coloration and turgor, no rashes  ED Course  Procedures (including critical care time) Labs Review Labs Reviewed  CBC - Abnormal; Notable for the following:    WBC 3.9 (*)    All other components within normal limits  BASIC METABOLIC PANEL  I-STAT TROPOININ, ED  I-STAT TROPOININ, ED    Imaging Review No results found.   EKG Interpretation  Date/Time:  Monday April 15 2015 11:33:34 EDT Ventricular Rate:  79 PR Interval:  232 QRS Duration: 139 QT Interval:  431 QTC Calculation: 494 R Axis:   93 Text Interpretation:  Sinus rhythm Prolonged PR interval Probable left  atrial enlargement Right bundle branch block Baseline wander in lead(s) V2  Since previous tracing rate slower Confirmed by [facility]  MD, Kamiya Acord [phone])  on 04/15/2015 11:44:21 AM      MDM   Final diagnoses:  Chest pain, unspecified chest pain type    Pt presenting with c/o chest pain which has been ongoing over the past week.  Pt has had 2 sets of troponin- doubt PE.  CXR is reassuring.  EKg is  reassuring with RBBB which is unchanged.  Discharged with strict return precautions.  Pt agreeable with plan.    Alfonzo Beers, MD 04/17/15 [phone]

## 2015-04-15 NOTE — Discharge Instructions (Signed)
Return to the ED with any concerns including difficulty breathing, fainting, vomiting and not able to keep down liquids, decreased level of alertness/lethargy, or any other alarming symptoms

## 2015-04-15 NOTE — ED Notes (Signed)
Per EMS- Pt is coming from [place] c/o cp and back pain for 1 week. Went to PCP and told to follow up with cardiology, scheduled appt for august. Reports over weekend pain got worse and felt sob, dizzy this morning. Denies cp at current. Given 324 aspirin, BP 180/80, HR 88 RBBB, 16 RR, 98% RA.

## 2015-04-16 ENCOUNTER — Other Ambulatory Visit: Payer: Self-pay

## 2015-04-16 DIAGNOSIS — Z1231 Encounter for screening mammogram for malignant neoplasm of breast: Secondary | ICD-10-CM

## 2015-04-19 ENCOUNTER — Ambulatory Visit (INDEPENDENT_AMBULATORY_CARE_PROVIDER_SITE_OTHER): Payer: Medicare Other | Admitting: Family Medicine

## 2015-04-19 ENCOUNTER — Encounter: Payer: Self-pay | Admitting: Family Medicine

## 2015-04-19 VITALS — BP 120/64 | HR 89 | Temp 98.5°F | Wt 120.0 lb

## 2015-04-19 DIAGNOSIS — R079 Chest pain, unspecified: Secondary | ICD-10-CM | POA: Diagnosis not present

## 2015-04-19 DIAGNOSIS — R0602 Shortness of breath: Secondary | ICD-10-CM | POA: Diagnosis not present

## 2015-04-19 NOTE — Patient Instructions (Signed)
We will call you within a week about your referral to get an echocardiogram and a stress test. If you do not hear within 10 days, give [place] a call. If they say your test is going to be in more than 2 weeks, also give me a call.   Continue to monitor your symptoms and just as you did this time- if you have new or worsening symptoms, please seek care in ER.   I think this is anxiety related. If above workup negative, likely treat as such. We will keep cards visit on the books fornow but may end up canceling

## 2015-04-19 NOTE — Assessment & Plan Note (Addendum)
Had referred to cards last visit for shortness of breath that is usually reproduced with stairs (former smoker but only 8 pack years and quit over 20 years ago) also with history of PVCs though no current palpitations and doubt arhythmia as cause. She has since started reporting chest pain and had ED visit with negative troponins and CXR and stable EKG. Mother with CHF and brother with stents at age 74. Cards visit isn't until august unfortunately. We will proceed forward with echo for the shortness of breath as she is concerned about CHF though I have low suspicion. We will also get lexiscan myoview study completed. She has a very hard time explaining her symptoms and then discusses stressors/anxiety. I think anxiety is probably high on differential but need to rule out underlying cardiac illness. Likely treat with SSRI if above 2 tests normal.   Keep cards visit for now but may cancel.

## 2015-04-19 NOTE — Progress Notes (Signed)
[name], MD  Subjective:  Brandy Conway is a 74 y.o. year old very pleasant female patient who presents with:  Chest Pain and shortness of breath -Seen in ED 04/15/15 for chest pain under left breast. Also had some lower abdominal cramping with some changes in food habits. No history cardiac issues. I had seen patient the week prior and referred to cardiology for shortness of breath. I was unaware visit was 2 months out. In Ed, Negative troponins, CXR reassuring, EKG unchanged. Had severe dizziness as well.   Patient concerned due to CHF in mother, brother with stents age 48. She continues to have a hard time explaining how often she is having issues or if issues are reproduced with exertion but it is clear that most times she goes up stairs she has shortness of breath and occasionally chest pain with this. She wonders if she is "inventing this in her mind" outloud to me. Last 5 years really hard as she lost her husband who was ill and Had to sell estate. She also has had issues with trigeminal neuralgia pain in L jaw predating shortness of breath and chest pain.   She takes aspirin daily as only medication.   ROS- no dizzinss since ED vsiit, no nausea, vomiting, diaphoresis  Past Medical History- Trigeminal neuralgia, CAP x 2 in last 12 months and states has never felt quite right after that, HLD with mild LDL elevation.   Medications- reviewed and updated Current Outpatient Prescriptions  Medication Sig Dispense Refill  . aspirin 325 MG buffered tablet Take 325 mg by mouth as needed.     Objective: BP 120/64 mmHg  Pulse 89  Temp(Src) 98.5 F (36.9 C)  Wt 120 lb (54.432 kg) Gen: NAD, resting comfortably CV: RRR no murmurs rubs or gallops No chest wall tenderness Lungs: CTAB no crackles, wheeze, rhonchi Abdomen: soft/nontender/nondistended/normal bowel sounds. No rebound or guarding.  Ext: no edema Skin: warm, dry, no rash over back or chest  Exam unchanged from last week  **  Assessment/Plan:  Shortness of breath Atypical chest pain Had referred to cards last visit for shortness of breath that is usually reproduced with stairs (former smoker but only 8 pack years and quit over 20 years ago) also with history of PVCs though no current palpitations and doubt arhythmia as cause. Risk factors: brother CAD 80, personal hyperlipidemia, former smoker but distant history. She has since started reporting chest pain and had ED visit with negative troponins and CXR and stable EKG. Mother with CHF and brother with stents at age 74. Cards visit isn't until august unfortunately. We will proceed forward with echo for the shortness of breath as she is concerned about CHF though I have low suspicion. We will also get lexiscan myoview study completed. She has a very hard time explaining her symptoms and then discusses stressors/anxiety. I think anxiety is probably high on differential but need to rule out underlying cardiac illness. Likely treat with SSRI if above 2 tests normal.   Keep cards visit for now but may cancel.   Return precautions advised for ED visit and make plan for follow up based off results below.   Orders Placed This Encounter  Procedures  . Myocardial Perfusion Imaging    Standing Status: Future     Number of Occurrences:      Standing Expiration Date: 04/18/2016    Order Specific Question:  Where should this test be performed    Answer:  [facility])  Order Specific Question:  Type of stress    Answer:  Lexiscan    Order Specific Question:  Patient weight in lbs    Answer:  120  . ECHOCARDIOGRAM COMPLETE    Standing Status: Future     Number of Occurrences:      Standing Expiration Date: 07/19/2016    Scheduling Instructions:     Shortness of breath. Mother with CHF. Think anxiety likely cause.    Order Specific Question:  Where should this test be performed    Answer:  [facility])    Order Specific  Question:  Complete or Limited study?    Answer:  Complete    Order Specific Question:  With Image Enhancing Agent or without Image Enhancing Agent?    Answer:  With Image Enhancing Agent    Order Specific Question:  Reason for exam-Echo    Answer:  Other - See Comments Section

## 2015-04-24 ENCOUNTER — Other Ambulatory Visit: Payer: Self-pay

## 2015-04-24 ENCOUNTER — Ambulatory Visit (HOSPITAL_COMMUNITY): Payer: Medicare Other | Attending: Cardiology

## 2015-04-24 DIAGNOSIS — I059 Rheumatic mitral valve disease, unspecified: Secondary | ICD-10-CM | POA: Insufficient documentation

## 2015-04-24 DIAGNOSIS — I340 Nonrheumatic mitral (valve) insufficiency: Secondary | ICD-10-CM | POA: Insufficient documentation

## 2015-04-24 DIAGNOSIS — R0602 Shortness of breath: Secondary | ICD-10-CM

## 2015-04-24 DIAGNOSIS — Z87891 Personal history of nicotine dependence: Secondary | ICD-10-CM | POA: Insufficient documentation

## 2015-04-24 DIAGNOSIS — R079 Chest pain, unspecified: Secondary | ICD-10-CM | POA: Diagnosis not present

## 2015-05-01 ENCOUNTER — Telehealth (HOSPITAL_COMMUNITY): Payer: Self-pay | Admitting: *Deleted

## 2015-05-01 NOTE — Telephone Encounter (Signed)
Patient given detailed instructions per Myocardial Perfusion Study Information Sheet for test on 05/03/15 at 11:45. Patient Notified to arrive 15 minutes early, and that it is imperative to arrive on time for appointment to keep from having the test rescheduled. Patient verbalized understanding. Hubbard Robinson, RN

## 2015-05-01 NOTE — Telephone Encounter (Signed)
Left message on voicemail in reference to upcoming appointment scheduled for 05/03/15. Phone number given for a call back so details instructions can be given. Hubbard Robinson, RN

## 2015-05-03 ENCOUNTER — Ambulatory Visit (HOSPITAL_COMMUNITY): Payer: Medicare Other | Attending: Internal Medicine

## 2015-05-03 DIAGNOSIS — R079 Chest pain, unspecified: Secondary | ICD-10-CM | POA: Diagnosis not present

## 2015-05-03 DIAGNOSIS — R0602 Shortness of breath: Secondary | ICD-10-CM | POA: Diagnosis not present

## 2015-05-03 LAB — MYOCARDIAL PERFUSION IMAGING
CHL CUP NUCLEAR SRS: 3
LHR: 0.26
LV dias vol: 90 mL
LVSYSVOL: 38 mL
NUC STRESS TID: 1.05
Peak HR: 97 {beats}/min
Rest HR: 73 {beats}/min
SDS: 0
SSS: 3

## 2015-05-03 MED ORDER — TECHNETIUM TC 99M SESTAMIBI GENERIC - CARDIOLITE
30.6000 | Freq: Once | INTRAVENOUS | Status: AC | PRN
  Administered 2015-05-03: 31 via INTRAVENOUS

## 2015-05-03 MED ORDER — TECHNETIUM TC 99M SESTAMIBI GENERIC - CARDIOLITE
10.8000 | Freq: Once | INTRAVENOUS | Status: AC | PRN
  Administered 2015-05-03: 11 via INTRAVENOUS

## 2015-05-03 MED ORDER — REGADENOSON 0.4 MG/5ML IV SOLN
0.4000 mg | Freq: Once | INTRAVENOUS | Status: AC
  Administered 2015-05-03: 0.4 mg via INTRAVENOUS

## 2015-05-14 ENCOUNTER — Encounter: Payer: Self-pay | Admitting: Neurology

## 2015-05-14 ENCOUNTER — Ambulatory Visit (INDEPENDENT_AMBULATORY_CARE_PROVIDER_SITE_OTHER): Payer: Medicare Other | Admitting: Neurology

## 2015-05-14 VITALS — BP 114/70 | HR 66 | Resp 18 | Ht 68.0 in | Wt 117.7 lb

## 2015-05-14 DIAGNOSIS — G500 Trigeminal neuralgia: Secondary | ICD-10-CM | POA: Diagnosis not present

## 2015-05-14 MED ORDER — OXCARBAZEPINE 150 MG PO TABS: ORAL_TABLET | ORAL | Status: DC

## 2015-05-14 NOTE — Progress Notes (Signed)
NEUROLOGY CONSULTATION NOTE  Brandy Conway MRN: [id number] DOB: 1941/01/24  Referring provider: Dr. Yong Channel Primary care provider: Dr. Yong Channel  Reason for consult:  Trigeminal neuralgia.  HISTORY OF PRESENT ILLNESS: Brandy Conway is a 74 year old right-handed woman  who presents for left-handed trigeminal neuralgia.  PCP note and labs reviewed.  As per PCP note, she began experiencing left-sided facial pain in 2005.  It was manageable.  She would experience a couple of brief shocks a day and will then be symptom-free for several weeks to months.  She never required medication.  She has been under increased stress over the past year.  Her husband passed away last year and she has no family locally.  She also had two bouts of pneumonia since December.  Due to chest pain, she is undergoing a cardiac workup.  For the past several months, the left-facial pain has been relentless and very painful.  It is constant shocks from her cheek up to her eye, ear and jaw.  There is no associated numbness.  She notes tinnitus and perceived hearing loss in the left ear.  However, she was evaluated by ENT and her hearing was fine.  She saw the dentist who didn't find anything wrong.  She is unable to eat, sleep or brush her teeth.  She was prescribed carbamazepine, which was withheld until after she is seen by cardiology.    CBC from June showed borderline low WBC of 3.9.  BMP was unremarkable.  Na level was 139.    PAST MEDICAL HISTORY: Past Medical History  Diagnosis Date  . Allergy   . Facial nerve sensory disorder   . History of skin cancer   . Cancer     PAST SURGICAL HISTORY: Past Surgical History  Procedure Laterality Date  . Abdominal hysterectomy    . Tonsillectomy and adenoidectomy      MEDICATIONS: Current Outpatient Prescriptions on File Prior to Visit  Medication Sig Dispense Refill  . aspirin 325 MG buffered tablet Take 325 mg by mouth as needed.     No current facility-administered  medications on file prior to visit.    ALLERGIES: No Known Allergies  FAMILY HISTORY: Family History  Problem Relation Age of Onset  . Heart disease Mother     CHF  . Heart disease Brother 75    stents  . Cancer Son     brain   . Asthma Maternal Grandmother   . Hypertension Maternal Grandmother   . Cancer Paternal Grandfather     liver  . Hypertension Paternal Grandmother     SOCIAL HISTORY: History   Social History  . Marital Status: Married    Spouse Name: N/A  . Number of Children: N/A  . Years of Education: N/A   Occupational History  . Not on file.   Social History Main Topics  . Smoking status: Former Smoker -- 0.50 packs/day for 16 years    Types: Cigarettes    Quit date: 11/02/1978  . Smokeless tobacco: Not on file  . Alcohol Use: 4.2 oz/week    7 Standard drinks or equivalent per week     Comment: rare  . Drug Use: No  . Sexual Activity: No   Other Topics Concern  . Not on file   Social History Narrative   Widowed in March 2015-sick for a long time. 1 son- lost at age 41.5 sudden death-malignant brain tumor ruptured. No pets.       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      Hobbies: lives at [employer] (retirement community)-exercise, playing bingo, reading    REVIEW OF SYSTEMS: Constitutional: No fevers, chills, or sweats, no generalized fatigue, change in appetite Eyes: No visual changes, double vision, eye pain Ear, nose and throat: No hearing loss, ear pain, nasal congestion, sore throat Cardiovascular: Chest disconfort Respiratory:  Shortness of breath GastrointestinaI: No nausea, vomiting, diarrhea, abdominal pain, fecal incontinence Genitourinary:  No dysuria, urinary retention or frequency Musculoskeletal:  No neck pain, back pain Integumentary: No rash, pruritus, skin lesions Neurological: as above Psychiatric: No depression, insomnia, anxiety Endocrine: No palpitations, fatigue,  diaphoresis, mood swings, change in appetite, change in weight, increased thirst Hematologic/Lymphatic:  No anemia, purpura, petechiae. Allergic/Immunologic: no itchy/runny eyes, nasal congestion, recent allergic reactions, rashes  PHYSICAL EXAM: Filed Vitals:   05/14/15 0934  BP: 114/70  Pulse: 66  Resp: 18   General: Patient appears well-groomed.  She is in some discomfort.. Head:  Normocephalic/atraumatic Eyes:  fundi unremarkable, without vessel changes, exudates, hemorrhages or papilledema. Neck: supple, no paraspinal tenderness, full range of motion Back: No paraspinal tenderness Heart: regular rate and rhythm Lungs: Clear to auscultation bilaterally. Vascular: No carotid bruits. Neurological Exam: Mental status: alert and oriented to person, place, and time, recent and remote memory intact, fund of knowledge intact, attention and concentration intact, speech fluent and not dysarthric, language intact. Cranial nerves: CN I: not tested CN II: pupils equal, round and reactive to light, visual fields intact, fundi unremarkable, without vessel changes, exudates, hemorrhages or papilledema. CN III, IV, VI:  full range of motion, no nystagmus, no ptosis CN V: facial sensation intact CN VII: upper and lower face symmetric CN VIII: hearing intact CN IX, X: gag intact, uvula midline CN XI: sternocleidomastoid and trapezius muscles intact CN XII: tongue midline Bulk & Tone: normal, no fasciculations. Motor:  5/5 throughout Sensation:  Pinprick and vibration intact Deep Tendon Reflexes:  2+ throughout except absent in ankles.  Toes downgoing. Finger to nose testing:  No dysmetria Heel to shin:  No dysmetria Gait:  Normal station and stride. Unable to tandem walk. Romberg negative.  IMPRESSION: Left-sided trigeminal neuralgia  PLAN: 1.  Start oxcarbazepine 150mg  twice daily for 7 days, then increasing to 300mg  twice daily.  Side effects such as dizziness and sleepiness  discussed..  She should call in 3 weeks with update and we can increase dose if needed. 2.  Follow up in 6 weeks.  Thank you for allowing me to take part in the care of this patient.  Metta Clines, DO  CC:  [name], MD

## 2015-05-14 NOTE — Patient Instructions (Signed)
Start oxcarbazepine 150mg  tablets.  Take 1 tablet twice daily for 7 days, then 2 tablets twice daily.  Call in 3 weeks with update.  Caution for dizziness or sleepiness. Follow up in 6 weeks.

## 2015-05-16 ENCOUNTER — Telehealth: Payer: Self-pay | Admitting: Neurology

## 2015-05-16 NOTE — Telephone Encounter (Signed)
I have left a message for this patient to return my call

## 2015-05-16 NOTE — Telephone Encounter (Signed)
Pt called and wanted to speak to Dr Georgie Chard nurse/told her I could take a message and have the nurse call but she got mad said she would call back later/Dawn CB# [phone]

## 2015-05-24 ENCOUNTER — Ambulatory Visit
Admission: RE | Admit: 2015-05-24 | Discharge: 2015-05-24 | Disposition: A | Discharge status: Home / Self Care | Payer: Medicare Other | Source: Ambulatory Visit

## 2015-05-24 DIAGNOSIS — Z1231 Encounter for screening mammogram for malignant neoplasm of breast: Secondary | ICD-10-CM | POA: Diagnosis not present

## 2015-06-07 ENCOUNTER — Ambulatory Visit (INDEPENDENT_AMBULATORY_CARE_PROVIDER_SITE_OTHER): Payer: Medicare Other | Admitting: Cardiology

## 2015-06-07 ENCOUNTER — Encounter: Payer: Self-pay | Admitting: Cardiology

## 2015-06-07 VITALS — BP 124/62 | HR 99 | Ht 68.0 in | Wt 120.0 lb

## 2015-06-07 DIAGNOSIS — R072 Precordial pain: Secondary | ICD-10-CM

## 2015-06-07 DIAGNOSIS — G500 Trigeminal neuralgia: Secondary | ICD-10-CM | POA: Diagnosis not present

## 2015-06-07 DIAGNOSIS — E785 Hyperlipidemia, unspecified: Secondary | ICD-10-CM | POA: Diagnosis not present

## 2015-06-07 DIAGNOSIS — R0602 Shortness of breath: Secondary | ICD-10-CM | POA: Diagnosis not present

## 2015-06-07 DIAGNOSIS — R0989 Other specified symptoms and signs involving the circulatory and respiratory systems: Secondary | ICD-10-CM

## 2015-06-07 DIAGNOSIS — R079 Chest pain, unspecified: Secondary | ICD-10-CM | POA: Insufficient documentation

## 2015-06-07 NOTE — Patient Instructions (Addendum)
Medication Instructions:   Your physician recommends that you continue on your current medications as directed. Please refer to the Current Medication list given to you today.     Testing/Procedures:  Your physician has requested that you have a carotid duplex. This test is an ultrasound of the carotid arteries in your neck. It looks at blood flow through these arteries that supply the brain with blood. Allow one hour for this exam. There are no restrictions or special instructions.   Your physician has requested that you have a LEFT upper extremity arterial duplex. This test is an ultrasound of the arteries in the legs or arms. It looks at arterial blood flow in the legs and arms. Allow one hour for Lower and Upper Arterial scans. There are no restrictions or special instructions ONLY DO THIS IN THE LEFT UPPER EXTREMITY   Follow-Up:  AS NEEDED WITH DR Meda Coffee

## 2015-06-07 NOTE — Progress Notes (Signed)
Patient ID: Brandy Conway, female   DOB: [birth year], 74 y.o.   MRN: [id number]      Cardiology Office Note  Date:  06/07/2015   ID:  Brandy Conway, DOB 1941-06-04, MRN [id number]  PCP:  [name], MD  Cardiologist:  Dorothy Spark, MD   No chief complaint on file.  History of Present Illness: Brandy Conway is a 74 y.o. female who presents for evaluation of chest pain. This is a very pleasant female who lost her husband 1 year ago. She has been living alone, they had 1 son who died previously. She has developed episodes of chest pain associated with SOB that are not related to exertion. No syncope, no LE edema, orthopnea. She goes to the gym and denies any CP or SOB with exercise. She is also concerned that she has weak pulse in her LUE with difficulties measuring BP there. No weakness in her LUE.    Past Medical History  Diagnosis Date  . Allergy   . Facial nerve sensory disorder   . History of skin cancer   . Cancer     Past Surgical History  Procedure Laterality Date  . Abdominal hysterectomy    . Tonsillectomy and adenoidectomy     Current Outpatient Prescriptions  Medication Sig Dispense Refill  . aspirin 81 MG tablet Take 81 mg by mouth daily.    . Multiple Vitamins-Minerals (ONE-A-DAY 50 PLUS PO) Take 1 tablet by mouth daily.     No current facility-administered medications for this visit.    Allergies:   Review of patient's allergies indicates no known allergies.    Social History:  The patient  reports that she quit smoking about 36 years ago. Her smoking use included Cigarettes. She has a 8 pack-year smoking history. She does not have any smokeless tobacco history on file. She reports that she drinks about 4.2 oz of alcohol per week. She reports that she does not use illicit drugs.   Family History:  The patient's family history includes Asthma in her maternal grandmother; Cancer in her paternal grandfather and son; Heart disease in her mother; Heart disease (age of  onset: 2) in her brother; Hypertension in her maternal grandmother and paternal grandmother.    ROS:  Please see the history of present illness.   Otherwise, review of systems are positive for none.   All other systems are reviewed and negative.    PHYSICAL EXAM: VS:  BP 124/62 mmHg  Pulse 99  Ht 5\' 8"  (1.727 m)  Wt 120 lb (54.432 kg)  BMI 18.25 kg/m2 , BMI Body mass index is 18.25 kg/(m^2). GEN: Well nourished, well developed, in no acute distress HEENT: normal Neck: no JVD, carotid bruits, or masses Cardiac: RRR; no murmurs, rubs, or gallops,no edema, weak pulse in left radial artery Respiratory:  clear to auscultation bilaterally, normal work of breathing GI: soft, nontender, nondistended, + BS MS: no deformity or atrophy Skin: warm and dry, no rash Neuro:  Strength and sensation are intact Psych: euthymic mood, full affect  EKG:  EKG is not ordered today. 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/15 shows SR, 1.AVB and RBBB  Recent Labs: 09/24/2014: ALT 16; TSH 1.19 11/08/2014: B Natriuretic Peptide 144.9* 04/15/2015: BUN 13; Creatinine, Ser 0.50; Hemoglobin 12.8; Platelets 205; Potassium 3.9; Sodium 139    Lipid Panel    Component Value Date/Time   CHOL 217* 09/24/2014 1218   TRIG 54.0 09/24/2014 1218   TRIG 32 09/28/2006 1516   HDL 96.00  09/24/2014 1218   CHOLHDL 2 09/24/2014 1218   CHOLHDL 2.6 CALC 09/28/2006 1516   VLDL 10.8 09/24/2014 1218   LDLCALC 110* 09/24/2014 1218   LDLDIRECT 122.1 09/28/2006 1516   Wt Readings from Last 3 Encounters:  06/07/15 120 lb (54.432 kg)  05/14/15 117 lb 11.2 oz (53.388 kg)  05/03/15 120 lb (54.432 kg)    TTE: 04/24/2015 Study Conclusions  - Left ventricle: The cavity size was normal. Systolic function was normal. The estimated ejection fraction was in the range of 55% to 60%. Wall motion was normal; there were no regional wall motion abnormalities. Doppler parameters are consistent with abnormal left ventricular relaxation (grade 1  diastolic dysfunction). - Mitral valve: Calcified annulus. Moderately thickened leaflets . There was mild regurgitation. Left atrium: The atrium was normal in size. Right ventricle: The cavity size was normal. Wall thickness was normal. Systolic function was normal.  ECG: SR, RBBB    ASSESSMENT AND PLAN:  1.  Atypical chest pain - most probably related to depression and anxiety. The patient had  Normal echocardiogram and a stress test. She has no symptoms with exertion. She is advised to join silver sneakers. She is also advised to visit a psychiatrist for further management.  2. Unequal upper extremity pulse - we will order carotid US and LEU arterial US.  Follow up as needed.   Signed, Dorothy Spark, MD  06/07/2015 3:47 PM    [facility] [place], [place], [place]  [address] Phone: [phone]; Fax: [phone]

## 2015-06-24 ENCOUNTER — Telehealth: Payer: Self-pay | Admitting: Cardiology

## 2015-06-24 DIAGNOSIS — I739 Peripheral vascular disease, unspecified: Secondary | ICD-10-CM

## 2015-06-24 DIAGNOSIS — R600 Localized edema: Secondary | ICD-10-CM

## 2015-06-24 NOTE — Telephone Encounter (Signed)
Patient is coming in for carotid duplex and arterial US of  Left upper extremity. Patient is complaining of her bilateral lower extremities (BLE), stating they are black/blue, painful, and having bulging/swelling. Patient wants her BLE ultra sound too, while she is getting all the other ultra sounds done. Informed patient that there is no order for her BLE, but I would have to get an order from Dr. Meda Coffee. Will route to Dr. Meda Coffee for her recommendation and order, if needed.

## 2015-06-24 NOTE — Telephone Encounter (Signed)
New message     Pt has questions regarding upcoming test Pt thought she was having test to check her legs  Please call to discuss

## 2015-06-25 ENCOUNTER — Ambulatory Visit: Payer: Medicare Other | Admitting: Neurology

## 2015-06-25 NOTE — Telephone Encounter (Signed)
I agree, please order B/L LE arterial US.

## 2015-06-25 NOTE — Telephone Encounter (Signed)
Left message to call back to endorse Dr Francesca Oman recommendations for the pt to have a B/L LE arterial US as requested by the pt.

## 2015-06-26 DIAGNOSIS — I739 Peripheral vascular disease, unspecified: Secondary | ICD-10-CM | POA: Insufficient documentation

## 2015-06-26 DIAGNOSIS — R600 Localized edema: Secondary | ICD-10-CM

## 2015-06-26 HISTORY — DX: Peripheral vascular disease, unspecified: I73.9

## 2015-06-26 HISTORY — DX: Localized edema: R60.0

## 2015-06-26 NOTE — Telephone Encounter (Signed)
Spoke with scheduling.  The pts B/L LE arterial US will be scheduled for tomorrow 06/27/15 at [facility] location at 11 am, right after her upper extremity US.  Scheduling will call the pt and make her aware of this scheduled appt.

## 2015-06-26 NOTE — Telephone Encounter (Signed)
Notified the pt that per Dr Meda Coffee, it is ok to order a B/L LE arterial ultrasound for the pt for complaints of claudication and LEE.  Informed the pt that I will go and speak with our scheduling dept to see if we can add this study on to her scheduled PV appt set for tomorrow 8/25 at our [facility] office.   Informed the pt that she will get a call back from our scheduling dept, making her aware when this study can be done and what time it will be.  Pt verbalized understanding and agrees with this plan.

## 2015-06-26 NOTE — Telephone Encounter (Signed)
F/u ° ° °Pt returning your call °

## 2015-06-27 ENCOUNTER — Ambulatory Visit (HOSPITAL_COMMUNITY)
Admission: RE | Admit: 2015-06-27 | Discharge: 2015-06-27 | Disposition: A | Discharge status: Home / Self Care | Payer: Medicare Other | Source: Ambulatory Visit | Attending: Cardiology | Admitting: Cardiology

## 2015-06-27 ENCOUNTER — Other Ambulatory Visit: Payer: Self-pay | Admitting: Cardiology

## 2015-06-27 ENCOUNTER — Other Ambulatory Visit (HOSPITAL_COMMUNITY): Payer: Self-pay

## 2015-06-27 ENCOUNTER — Ambulatory Visit (HOSPITAL_BASED_OUTPATIENT_CLINIC_OR_DEPARTMENT_OTHER)
Admission: RE | Admit: 2015-06-27 | Discharge: 2015-06-27 | Disposition: A | Discharge status: Home / Self Care | Payer: Medicare Other | Source: Ambulatory Visit | Attending: Cardiology | Admitting: Cardiology

## 2015-06-27 DIAGNOSIS — R600 Localized edema: Secondary | ICD-10-CM

## 2015-06-27 DIAGNOSIS — I739 Peripheral vascular disease, unspecified: Secondary | ICD-10-CM | POA: Diagnosis not present

## 2015-06-27 DIAGNOSIS — R072 Precordial pain: Secondary | ICD-10-CM | POA: Insufficient documentation

## 2015-06-27 DIAGNOSIS — R0602 Shortness of breath: Secondary | ICD-10-CM | POA: Diagnosis not present

## 2015-06-27 DIAGNOSIS — I6523 Occlusion and stenosis of bilateral carotid arteries: Secondary | ICD-10-CM | POA: Insufficient documentation

## 2015-06-27 DIAGNOSIS — E785 Hyperlipidemia, unspecified: Secondary | ICD-10-CM | POA: Insufficient documentation

## 2015-06-27 DIAGNOSIS — R0989 Other specified symptoms and signs involving the circulatory and respiratory systems: Secondary | ICD-10-CM | POA: Diagnosis not present

## 2015-06-27 DIAGNOSIS — G500 Trigeminal neuralgia: Secondary | ICD-10-CM | POA: Diagnosis not present

## 2015-06-27 NOTE — Progress Notes (Signed)
Patient ID: Brandy Conway, female   DOB: Jun 08, 1941, 74 y.o.   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

## 2015-07-03 DIAGNOSIS — L821 Other seborrheic keratosis: Secondary | ICD-10-CM | POA: Diagnosis not present

## 2015-07-03 DIAGNOSIS — D1801 Hemangioma of skin and subcutaneous tissue: Secondary | ICD-10-CM | POA: Diagnosis not present

## 2015-07-03 DIAGNOSIS — Z85828 Personal history of other malignant neoplasm of skin: Secondary | ICD-10-CM | POA: Diagnosis not present

## 2015-07-03 DIAGNOSIS — D229 Melanocytic nevi, unspecified: Secondary | ICD-10-CM | POA: Diagnosis not present

## 2015-07-16 ENCOUNTER — Encounter: Payer: Self-pay | Admitting: Cardiovascular Disease

## 2015-07-16 ENCOUNTER — Ambulatory Visit (INDEPENDENT_AMBULATORY_CARE_PROVIDER_SITE_OTHER): Payer: Medicare Other | Admitting: Cardiovascular Disease

## 2015-07-16 VITALS — BP 132/68 | HR 68 | Ht 68.0 in | Wt 119.2 lb

## 2015-07-16 DIAGNOSIS — I70208 Unspecified atherosclerosis of native arteries of extremities, other extremity: Secondary | ICD-10-CM

## 2015-07-16 DIAGNOSIS — E785 Hyperlipidemia, unspecified: Secondary | ICD-10-CM

## 2015-07-16 LAB — BASIC METABOLIC PANEL
BUN: 12 mg/dL (ref 6–23)
CHLORIDE: 103 meq/L (ref 96–112)
CO2: 30 meq/L (ref 19–32)
CREATININE: 0.55 mg/dL (ref 0.40–1.20)
Calcium: 9.5 mg/dL (ref 8.4–10.5)
GFR: 114.9 mL/min (ref >60.00)
GLUCOSE: 106 mg/dL — AB (ref 70–99)
POTASSIUM: 4 meq/L (ref 3.5–5.1)
Sodium: 140 mEq/L (ref 135–145)

## 2015-07-16 NOTE — Patient Instructions (Addendum)
Medication Instructions:  Your physician recommends that you continue on your current medications as directed. Please refer to the Current Medication list given to you today.  Labwork: Your physician recommends that you have lab work today: BMP  Testing/Procedures: Non-Cardiac CT Angiography (CTA), is a special type of CT scan that uses a computer to produce multi-dimensional views of major blood vessels throughout the body. In CT angiography, a contrast material is injected through an IV to help visualize the blood vessels. CTA of LEFT UPPER Extremity to evaluate brachial artery stenosis  Follow-Up: Your physician recommends that you schedule a follow-up appointment in: 3 MONTHS with Dr Fletcher Anon   Any Other Special Instructions Will Be Listed Below (If Applicable).

## 2015-07-18 DIAGNOSIS — I70208 Unspecified atherosclerosis of native arteries of extremities, other extremity: Secondary | ICD-10-CM | POA: Insufficient documentation

## 2015-07-18 HISTORY — DX: Unspecified atherosclerosis of native arteries of extremities, other extremity: I70.208

## 2015-07-18 NOTE — Assessment & Plan Note (Signed)
The patient had noninvasive Doppler evaluation which showed normal subclavian arteries bilaterally. 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  she seems to have mild symptoms related to this but overall seems to be downplaying the severity of her symptoms as she does not desire to proceed with any invasive evaluation.  I requested CTA of the left upper extremity to evaluate this and to see if there is risk of embolization.  The meantime, I recommend no blood pressure checks in the left arm. Continue aspirin daily.  I will reevaluate her symptoms in few months.

## 2015-07-18 NOTE — Progress Notes (Signed)
HPI  This is a 74 year old female who was referred by Dr. 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 She was seen by Dr. Meda Coffee for atypical chest pain which was felt to be due to stress and anxiety. Her husband died one year before. Cardiac workup was negative including stress test and echocardiogram.  She is a former smoker and is not diabetic. There is family history of coronary artery disease. She has been told about a week left arm pulse and blood pressure for a while. She complains that the left hand turns pale on occasions with some numbness. However, she does not have much discomfort or weakness. She reports not being limited significantly by this. She is right-handed.  No Known Allergies   Current Outpatient Prescriptions on File Prior to Visit  Medication Sig Dispense Refill  . Multiple Vitamins-Minerals (ONE-A-DAY 50 PLUS PO) Take 1 tablet by mouth daily.     No current facility-administered medications on file prior to visit.     Past Medical History  Diagnosis Date  . Allergy   . Facial nerve sensory disorder   . History of skin cancer   . Cancer      Past Surgical History  Procedure Laterality Date  . Abdominal hysterectomy    . Tonsillectomy and adenoidectomy       Family History  Problem Relation Age of Onset  . Heart disease Mother     CHF  . Heart disease Brother 75    stents  . Cancer Son     brain   . Asthma Maternal Grandmother   . Hypertension Maternal Grandmother   . Cancer Paternal Grandfather     liver  . Hypertension Paternal Grandmother      Social History   Social History  . Marital Status: Married    Spouse Name: N/A  . Number of Children: N/A  . Years of Education: N/A   Occupational History  . Not on file.   Social History Main Topics  . Smoking status: Former Smoker -- 0.50 packs/day for 16 years    Types: Cigarettes    Quit date: 11/02/1978  . Smokeless tobacco: Not on file  . Alcohol Use: 4.2 oz/week   7 Standard drinks or equivalent per week     Comment: rare  . Drug Use: No  . Sexual Activity: No   Other Topics Concern  . Not on file   Social History Narrative   Widowed in March 2015-sick for a long time. 1 son- lost at age 44.5 sudden death-malignant brain tumor ruptured. No pets.       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 It is negative other than that mentioned in the history of present illness.   PHYSICAL EXAM   BP 132/68 mmHg  Pulse 68  Ht 5\' 8"  (1.727 m)  Wt 119 lb 3.2 oz (54.069 kg)  BMI 18.13 kg/m2  SpO2 97% Constitutional: She is oriented to person, place, and time. She appears well-developed and well-nourished. No distress.  HENT: No nasal discharge.  Head: Normocephalic and atraumatic.  Eyes: Pupils are equal and round. No discharge.  Neck: Normal range of motion. Neck supple. No JVD present. No thyromegaly present.  Cardiovascular: Normal rate, regular rhythm, normal heart sounds. Exam reveals no gallop and no  friction rub. No murmur heard.  Pulmonary/Chest: Effort normal and breath sounds normal. No stridor. No respiratory distress. She has no wheezes. She has no rales. She exhibits no tenderness.  Abdominal: Soft. Bowel sounds are normal. She exhibits no distension. There is no tenderness. There is no rebound and no guarding.  Musculoskeletal: Normal range of motion. She exhibits no edema and no tenderness.  Neurological: She is alert and oriented to person, place, and time. Coordination normal.  Skin: Skin is warm and dry. No rash noted. She is not diaphoretic. No erythema. No pallor.  Psychiatric: She has a normal mood and affect. Her behavior is normal. Judgment and thought content normal.  Vascular: Radial pulses normal on the right side and absent on the left side.  Femoral pulses normal bilaterally and distal pulses are palpable.      ASSESSMENT AND PLAN

## 2015-07-18 NOTE — Assessment & Plan Note (Signed)
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

## 2015-07-22 ENCOUNTER — Ambulatory Visit (HOSPITAL_COMMUNITY)
Admission: RE | Admit: 2015-07-22 | Discharge: 2015-07-22 | Disposition: A | Discharge status: Home / Self Care | Payer: Medicare Other | Source: Ambulatory Visit | Attending: Cardiovascular Disease | Admitting: Cardiovascular Disease

## 2015-07-22 ENCOUNTER — Encounter (HOSPITAL_COMMUNITY): Payer: Self-pay

## 2015-07-22 DIAGNOSIS — I728 Aneurysm of other specified arteries: Secondary | ICD-10-CM | POA: Insufficient documentation

## 2015-07-22 DIAGNOSIS — I700 Atherosclerosis of aorta: Secondary | ICD-10-CM | POA: Diagnosis not present

## 2015-07-22 DIAGNOSIS — I742 Embolism and thrombosis of arteries of the upper extremities: Secondary | ICD-10-CM | POA: Diagnosis not present

## 2015-07-22 DIAGNOSIS — I70208 Unspecified atherosclerosis of native arteries of extremities, other extremity: Secondary | ICD-10-CM | POA: Diagnosis not present

## 2015-07-22 MED ORDER — IOHEXOL 350 MG/ML SOLN
100.0000 mL | Freq: Once | INTRAVENOUS | Status: AC | PRN
  Administered 2015-07-22: 100 mL via INTRAVENOUS

## 2015-08-12 DIAGNOSIS — Z23 Encounter for immunization: Secondary | ICD-10-CM | POA: Diagnosis not present

## 2015-10-10 IMAGING — CR DG CHEST 2V
2 series · 2 of 2 positions shown · non-contrast
Comparison: 02/12/2015 and earlier.

CLINICAL DATA: 73-year-old female with community-acquired
pneumonia, nodular right lung opacity in [place]. Subsequent
encounter.

EXAM:
CHEST  2 VIEW

[view not recorded (1 of 2)]
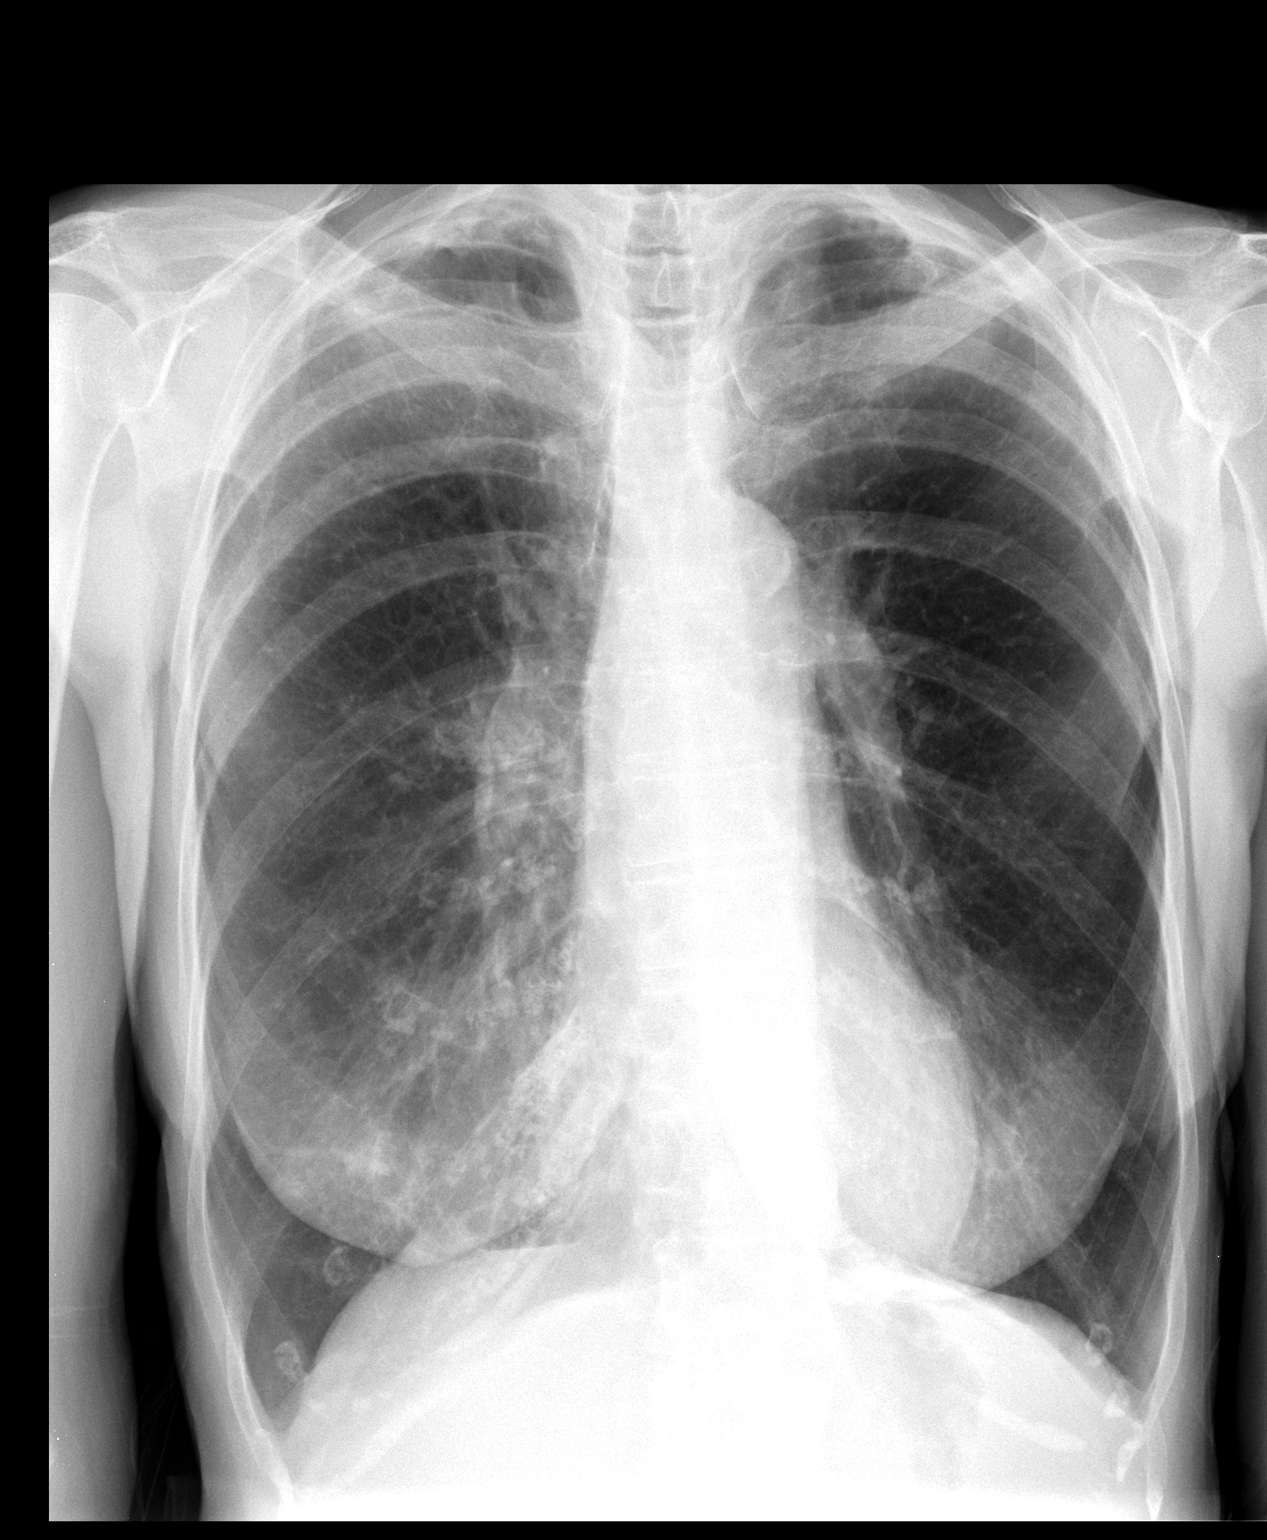

[view not recorded (2 of 2)]
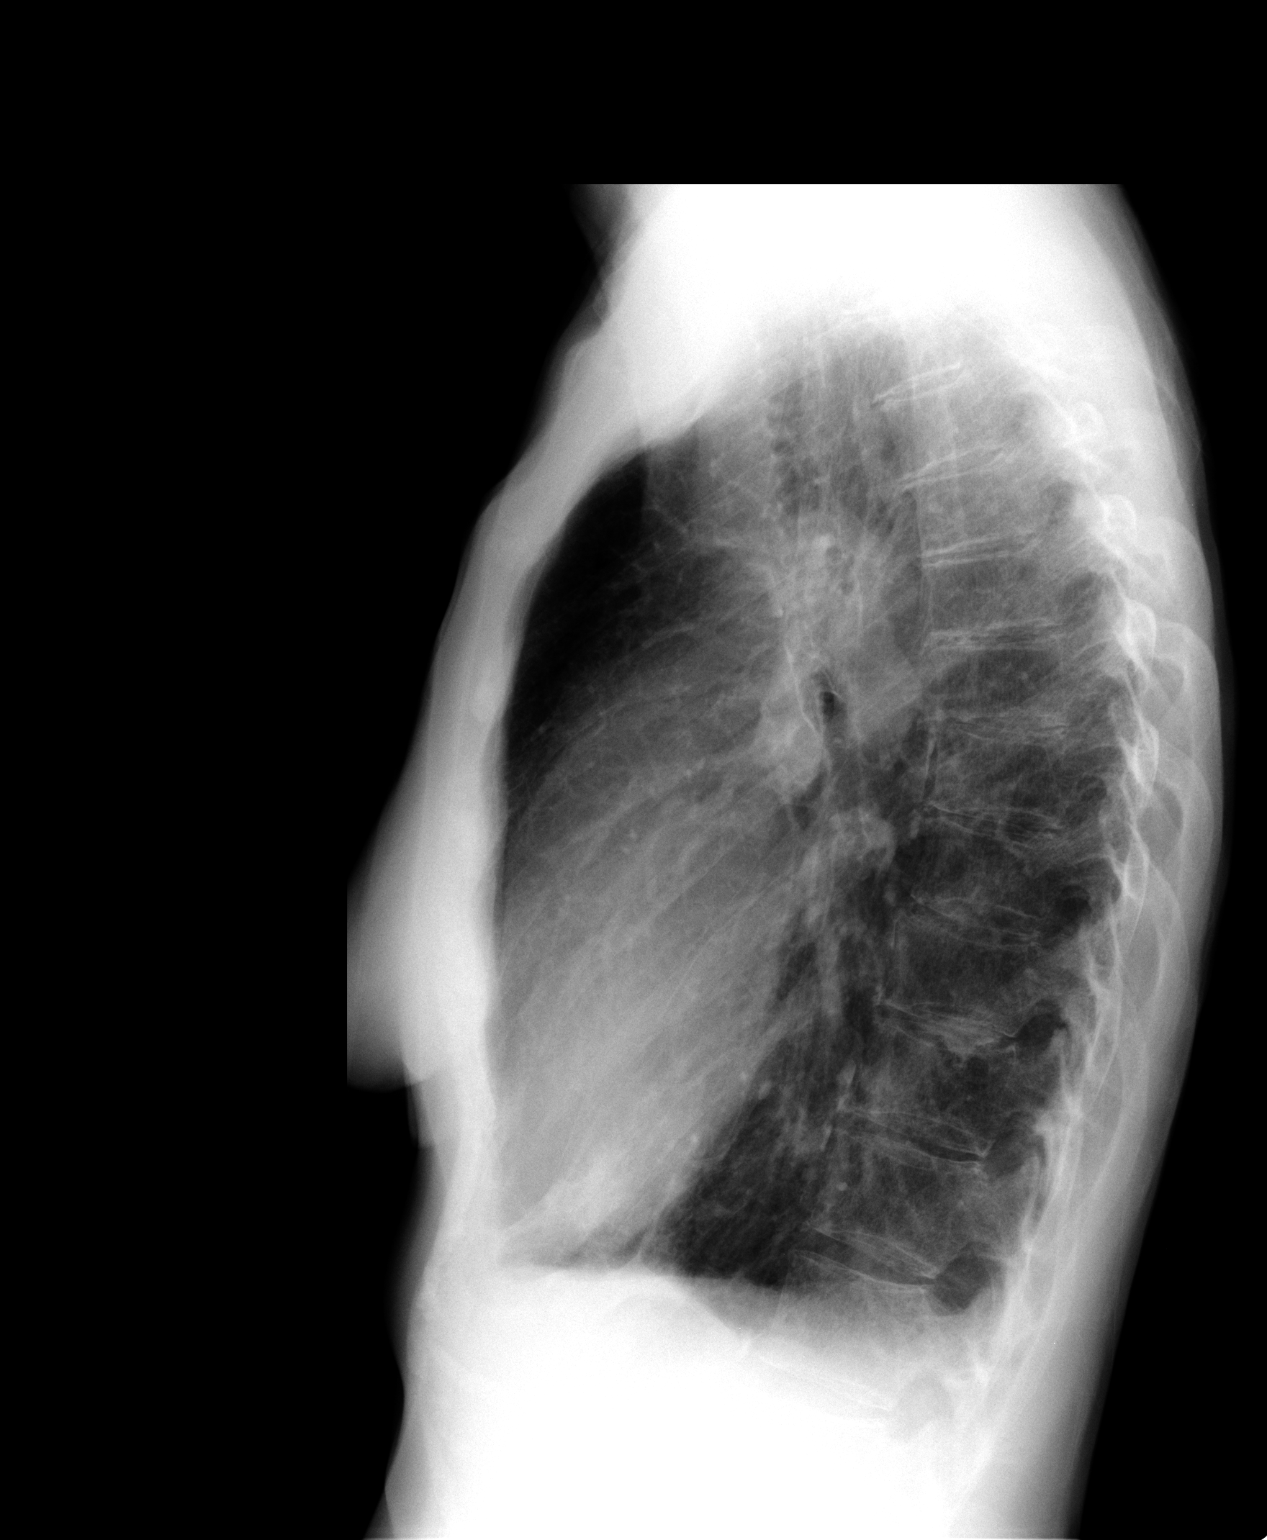

[2 of 2 positions shown; findings below may reference images not displayed]

FINDINGS: Large lung volumes with some perihilar architectural distortion and
confluent biapical scarring. Regressed right middle and lower lobe
nodular and confluent pulmonary opacity since [place]. Mild middle
lobe residual. The 14 mm nodular density previously described has
resolved. No superimposed pneumothorax, pulmonary edema, pleural
effusion, or new pulmonary opacity. Stable cardiac size and
mediastinal contours. Visualized tracheal air column is within
normal limits. No acute osseous abnormality identified.
IMPRESSION: Near complete resolution of right lung pneumonia, mild residual in
the middle lobe. Resolved 14 mm nodule seen in [place]. Chronic lung
disease. No new cardiopulmonary abnormality.

## 2015-10-15 ENCOUNTER — Ambulatory Visit: Payer: Medicare Other | Admitting: Cardiovascular Disease

## 2015-10-30 ENCOUNTER — Encounter: Payer: Self-pay | Admitting: *Deleted

## 2015-12-26 ENCOUNTER — Emergency Department (HOSPITAL_COMMUNITY)
Admission: EM | Admit: 2015-12-26 | Discharge: 2015-12-26 | Disposition: A | Discharge status: Home / Self Care | Payer: Medicare Other | Attending: Emergency Medicine | Admitting: Emergency Medicine

## 2015-12-26 ENCOUNTER — Encounter (HOSPITAL_COMMUNITY): Payer: Self-pay | Admitting: *Deleted

## 2015-12-26 ENCOUNTER — Telehealth: Payer: Self-pay | Admitting: Family Medicine

## 2015-12-26 ENCOUNTER — Emergency Department (HOSPITAL_COMMUNITY): Payer: Medicare Other

## 2015-12-26 DIAGNOSIS — G500 Trigeminal neuralgia: Secondary | ICD-10-CM | POA: Insufficient documentation

## 2015-12-26 DIAGNOSIS — Z79899 Other long term (current) drug therapy: Secondary | ICD-10-CM | POA: Diagnosis not present

## 2015-12-26 DIAGNOSIS — Z7982 Long term (current) use of aspirin: Secondary | ICD-10-CM | POA: Insufficient documentation

## 2015-12-26 DIAGNOSIS — Z87891 Personal history of nicotine dependence: Secondary | ICD-10-CM | POA: Insufficient documentation

## 2015-12-26 DIAGNOSIS — Z85828 Personal history of other malignant neoplasm of skin: Secondary | ICD-10-CM | POA: Insufficient documentation

## 2015-12-26 DIAGNOSIS — R51 Headache: Secondary | ICD-10-CM | POA: Diagnosis not present

## 2015-12-26 MED ORDER — CARBAMAZEPINE ER 100 MG PO TB12: 100.0000 mg | ORAL_TABLET | Freq: Two times a day (BID) | ORAL | Status: DC

## 2015-12-26 NOTE — Discharge Instructions (Signed)
Please read and follow all provided instructions.  Your diagnoses today include:  1. Trigeminal neuralgia    Tests performed today include:  Vital signs. See below for your results today.   Medications prescribed:   Take medications as prescribed   Home care instructions:  Follow any educational materials contained in this packet.  Follow-up instructions: Please follow-up with your primary care provider in the next 48 hours for further evaluation of symptoms and treatment   Return instructions:   Please return to the Emergency Department if you do not get better, if you get worse, or new symptoms OR  - Fever (temperature greater than 101.88F)  - Bleeding that does not stop with holding pressure to the area    -Severe pain (please note that you may be more sore the day after your accident)  - Chest Pain  - Difficulty breathing  - Severe nausea or vomiting  - Inability to tolerate food and liquids  - Passing out  - Skin becoming red around your wounds  - Change in mental status (confusion or lethargy)  - New numbness or weakness     Please return if you have any other emergent concerns.  Additional Information:  Your vital signs today were: BP 133/72 mmHg   Pulse 90   Temp(Src) 96.5 F (35.8 C) (Axillary)   Resp 21   SpO2 99% If your blood pressure (BP) was elevated above 135/85 this visit, please have this repeated by your doctor within one month. ---------------

## 2015-12-26 NOTE — Telephone Encounter (Signed)
Pt has been sch for tomorrow °

## 2015-12-26 NOTE — Telephone Encounter (Signed)
May see with same day tomorrow

## 2015-12-26 NOTE — ED Provider Notes (Signed)
CSN: [id number]     Arrival date & time 12/26/15  0800 History   First MD Initiated Contact with Patient 12/26/15 0957     Chief Complaint  Patient presents with  . Facial Pain   (Consider location/radiation/quality/duration/timing/severity/associated sxs/prior Treatment) HPI 75 y.o. female with a hx of Trigeminal Neuralgia, presents to the Emergency Department today complaining of left sided facial pain for several years. States that the pain feels like shock to the side of her face and is a burning/ache feeling. Pain is intermittent and 10/10. Has been seen by a dentist and neurologist for same complaint in the past. Neurologist started her on Carbamezapine, but patient only took a few days worth due to side effects of "feeling crazy and floating." No anaphylaxis. No hives. Called her neurologist who told her to DC medication and follow up in clinic. She did not follow up in clinic. Patient worried about tumor and adamantly wished to have imaging done of her head. No CP/SOB/ABD pain. No numbness/tingling. No N/V/D. No other symptoms noted.    Past Medical History  Diagnosis Date  . Allergy   . Facial nerve sensory disorder   . History of skin cancer   . Cancer [facility])    Past Surgical History  Procedure Laterality Date  . Abdominal hysterectomy    . Tonsillectomy and adenoidectomy     Family History  Problem Relation Age of Onset  . Heart disease Mother     CHF  . Heart disease Brother 75    stents  . Cancer Son     brain   . Asthma Maternal Grandmother   . Hypertension Maternal Grandmother   . Cancer Paternal Grandfather     liver  . Hypertension Paternal Grandmother    Social History  Substance Use Topics  . Smoking status: Former Smoker -- 0.50 packs/day for 16 years    Types: Cigarettes    Quit date: 11/02/1978  . Smokeless tobacco: None  . Alcohol Use: 4.2 oz/week    7 Standard drinks or equivalent per week     Comment: rare   OB History    No data available      Review of Systems ROS reviewed and all are negative for acute change except as noted in the HPI.  Allergies  Review of patient's allergies indicates no known allergies.  Home Medications   Prior to Admission medications   Medication Sig Start Date End Date Taking? Authorizing Provider  aspirin 325 MG tablet Take 325 mg by mouth daily.   Yes Historical Provider, MD  Multiple Vitamins-Minerals (ONE-A-DAY 50 PLUS PO) Take 1 tablet by mouth daily.   Yes Historical Provider, MD   BP 133/78 mmHg  Pulse 77  Temp(Src) 96.5 F (35.8 C) (Axillary)  Resp 17  SpO2 100%   Physical Exam  Constitutional: She is oriented to person, place, and time. She appears well-developed and well-nourished.  HENT:  Head: Normocephalic and atraumatic.  Right Ear: Tympanic membrane, external ear and ear canal normal.  Left Ear: Tympanic membrane, external ear and ear canal normal.  Nose: Nose normal.  Mouth/Throat: Uvula is midline, oropharynx is clear and moist and mucous membranes are normal.  Eyes: EOM are normal. Pupils are equal, round, and reactive to light.  Neck: Normal range of motion. Neck supple. No tracheal deviation present.  Cardiovascular: Normal rate, regular rhythm, S1 normal, S2 normal, normal heart sounds, intact distal pulses and normal pulses.   No murmur heard. Pulmonary/Chest: Effort normal and breath  sounds normal. No respiratory distress.  Abdominal: Soft.  Musculoskeletal: Normal range of motion.  Neurological: She is alert and oriented to person, place, and time.  Skin: Skin is warm and dry.  Psychiatric: She has a normal mood and affect. Her behavior is normal. Thought content normal.  Nursing note and vitals reviewed.   ED Course  Procedures (including critical care time) Labs Review Labs Reviewed - No data to display  Imaging Review Ct Head Wo Contrast  12/26/2015  CLINICAL DATA:  Left facial pain EXAM: CT HEAD WITHOUT CONTRAST TECHNIQUE: Contiguous axial images  were obtained from the base of the skull through the vertex without intravenous contrast. COMPARISON:  None. FINDINGS: Dilated very vascular space in the right basal ganglia. Mild global atrophy. No mass effect, midline shift, or acute hemorrhage. Mastoid air cells clear. Visualized paranasal sinuses clear. IMPRESSION: No acute intracranial pathology. Electronically Signed   By: Marybelle Killings M.D.   On: 12/26/2015 10:25   I have personally reviewed and evaluated these images and lab results as part of my medical decision-making.   EKG Interpretation None      MDM  I have reviewed relevant imaging studies.  I have reviewed the relevant previous healthcare records. I obtained HPI from historian. Patient discussed with supervising physician  ED Course:  Assessment: 75y F presents with Trigeminal Neuralgia. Seen by Neuro in the past, but non compliant with medication due to side effects. Only took a few days. No Anaphylaxis. No hives. Did not follow up as requested with neuro. Pt was worried about malignancy in brain. CT head negative. Counseled patient to retake medication and follow up with neurology as this is the mainstay treatment for her condition. Informed patient of ablation as well. On exam, NAD. VSS. Afebrile. Lungs CTA. Heart RRR. CN evaluated and unremarkable. Motor/sensory intact. Will DC patient home with medication and follow up with previous neurologist (Dr. Tomi Likens). At time of discharge, Patient is in no acute distress. Vital Signs are stable. Patient is able to ambulate. Patient able to tolerate PO.    Disposition/Plan:  DC Home Additional Verbal discharge instructions given and discussed with patient.  Pt Instructed to f/u with PCP in the next 48-72 hours for evaluation and treatment of symptoms. Return precautions given Pt acknowledges and agrees with plan  Supervising Physician Deno Etienne, DO   Final diagnoses:  Trigeminal neuralgia       Shary Decamp, PA-C 12/26/15  [place], DO 12/26/15 [phone]

## 2015-12-26 NOTE — ED Notes (Signed)
Dorothea Ogle PA at bedside assessing patient.

## 2015-12-26 NOTE — ED Notes (Signed)
Pt states for several years she has had intermittent L facial pain and "shocks".  She has been seen by dentist who referred her to a neurologist.  She states the neurologist placed her on meds that she reacted to and had to stop taking.  States no CT's MRI's ordered by MD's and she's worried she has a tumor.  States that the past few days the pain is uncontrolled and constant to the point where she can't eat, can't chew.  The "electric shocks" are worse laying down and she now states her hearing in her L ear is diminished.

## 2015-12-26 NOTE — Telephone Encounter (Signed)
See below

## 2015-12-26 NOTE — Telephone Encounter (Signed)
Patient went [facility] this morning, they told her to make and appointment in 48 hrs. There are no available appointment in that time frame, is there any way she can be seen at that time. Please call her and let her know.

## 2015-12-27 ENCOUNTER — Ambulatory Visit (INDEPENDENT_AMBULATORY_CARE_PROVIDER_SITE_OTHER): Payer: Medicare Other | Admitting: Family Medicine

## 2015-12-27 ENCOUNTER — Encounter: Payer: Self-pay | Admitting: Family Medicine

## 2015-12-27 VITALS — BP 122/70 | HR 96 | Temp 99.0°F | Wt 110.0 lb

## 2015-12-27 DIAGNOSIS — G500 Trigeminal neuralgia: Secondary | ICD-10-CM | POA: Diagnosis not present

## 2015-12-27 MED ORDER — CARBAMAZEPINE ER 100 MG PO TB12: 100.0000 mg | ORAL_TABLET | Freq: Two times a day (BID) | ORAL | Status: DC

## 2015-12-27 NOTE — Progress Notes (Signed)
[name], MD  Subjective:  Brandy Conway is a 75 y.o. year old very pleasant female patient who presents for/with See problem oriented charting ROS- weight loss due to poor PO from pain- now improving, no fever, chills, nausea, vomiting.   Past Medical History-  Patient Active Problem List   Diagnosis Date Noted  . CAP (community acquired pneumonia) 03/15/2015    Priority: Medium  . Hyperlipidemia 09/24/2014    Priority: Medium  . Trigeminal neuralgia 09/24/2014    Priority: Medium  . History of skin cancer     Priority: Low  . Brachial artery stenosis, left ([place]) 07/18/2015  . Claudication ([place]) 06/26/2015  . Lower extremity edema 06/26/2015  . Chest pain 06/07/2015  . Unequal blood pressure in upper extremities 06/07/2015  . Trigeminal neuralgia of left side of face 05/14/2015  . Shortness of breath 04/10/2015    Medications- reviewed and updated Current Outpatient Prescriptions  Medication Sig Dispense Refill  . aspirin 325 MG tablet Take 325 mg by mouth daily.    . carbamazepine (TEGRETOL-XR) 100 MG 12 hr tablet Take 1 tablet (100 mg total) by mouth 2 (two) times daily. 60 tablet 0  . Multiple Vitamins-Minerals (ONE-A-DAY 50 PLUS PO) Take 1 tablet by mouth daily.     No current facility-administered medications for this visit.    Objective: BP 122/70 mmHg  Pulse 96  Temp(Src) 99 F (37.2 C)  Wt 110 lb (49.896 kg) Gen: NAD, resting comfortably CV: RRR no murmurs rubs or gallops Lungs: CTAB no crackles, wheeze, rhonchi Abdomen: soft/nontender/nondistended/normal bowel sounds. No rebound or guarding.  Ext: no edema Skin: warm, dry Neuro: grossly normal, moves all extremities, no pain on left side of face  Assessment/Plan:  Trigeminal neuralgia S: Seen in ED for severe episode of pain from trigeminal neuralgia. Patient tells me lasted almost a week and then before she went in got even more painful. She lost weight due to the level of pain and mainly being only  able to drink. She had a son that died from brain cancer and this worried her- she went to ED to make sure was not a tumor. CT scan was negative for malignancy or acute cause of headache. This was very reassuring to patient and her level of stress/anxiety and pain subsequently went down. Denies pain since leaving emergency room despite never taking carbamazepine A/P: I did refill the medicine due to likelihood of recurrence being rather high with her years of issues. She felt like her "head was in the clouds" on medicine before but feels like she had increased anxiety as she was alone. Now living with niece and her family and thinks she can tolerate it better. I refilled medicine- she does not want to take it prophylactically at present but will start if has another episode likely. Did lower dose at 100mg  BID instead of 150 BID as last rx.    Return precautions advised.   Meds ordered this encounter  Medications  . carbamazepine (TEGRETOL-XR) 100 MG 12 hr tablet    Sig: Take 1 tablet (100 mg total) by mouth 2 (two) times daily.    Dispense:  60 tablet    Refill:  0   >50% of 20 minute office visit was spent on counseling- discussing med options, deciding whether to take, discussing dealing with pain- and coordination of care

## 2015-12-27 NOTE — Patient Instructions (Signed)
Trigeminal Neuralgia  Wrote you a new prescription for 30 days of medicine if you decide to take it. I think you will do better with this than last time since it is a lower dose. In addition- having family around for support should help.   I would start this if you have recurrent intolerable episodes of the pain (which unfortunately I suspect you will)

## 2015-12-27 NOTE — Assessment & Plan Note (Signed)
S: Seen in ED for severe episode of pain from trigeminal neuralgia. Patient tells me lasted almost a week and then before she went in got even more painful. She lost weight due to the level of pain and mainly being only able to drink. She had a son that died from brain cancer and this worried her- she went to ED to make sure was not a tumor. CT scan was negative for malignancy or acute cause of headache. This was very reassuring to patient and her level of stress/anxiety and pain subsequently went down. Denies pain since leaving emergency room despite never taking carbamazepine A/P: I did refill the medicine due to likelihood of recurrence being rather high with her years of issues. She felt like her "head was in the clouds" on medicine before but feels like she had increased anxiety as she was alone. Now living with niece and her family and thinks she can tolerate it better. I refilled medicine- she does not want to take it prophylactically at present but will start if has another episode likely. Did lower dose at 100mg  BID instead of 150 BID as last rx.

## 2016-01-29 ENCOUNTER — Telehealth: Payer: Self-pay | Admitting: Family Medicine

## 2016-01-29 NOTE — Telephone Encounter (Signed)
Pt call to say that she took the following rx to two different pharmacies and was told they do not make this anymore.She is asking if there is something else  carbamazepine (TEGRETOL-XR) 100 MG 12 hr tablet

## 2016-01-30 MED ORDER — OXCARBAZEPINE 150 MG PO TABS: ORAL_TABLET | ORAL | Status: DC

## 2016-01-30 NOTE — Telephone Encounter (Signed)
See below

## 2016-01-30 NOTE — Telephone Encounter (Signed)
Called and spoke with [facility] pharmacy and they state that they have not heard of this medication not being made anymore, she states that it looks to be on backorder.

## 2016-01-30 NOTE — Telephone Encounter (Signed)
Call her pharmacy- what doses of extended release carbamazepine are available?

## 2016-01-30 NOTE — Telephone Encounter (Signed)
Sent in the medicine Dr. Tomi Likens previously prescribed. I would encourage her to try this and schedule follow up with him in 6 weeks as he had discussed when he started this on 05/14/15

## 2016-03-25 ENCOUNTER — Telehealth: Payer: Self-pay | Admitting: Family Medicine

## 2016-03-25 NOTE — Telephone Encounter (Signed)
Patient Name: Brandy Conway DOB: [id number] Initial Comment Caller states she may be having a panic attack. Her RT was bothering her it was tingling/numb. She would like to make an appointment Nurse Assessment Nurse: Ronnald Ramp, RN, Miranda Date/Time Eilene Ghazi Time): 03/25/2016 12:40:57 PM Confirm and document reason for call. If symptomatic, describe symptoms. You must click the next button to save text entered. ---Caller states when she woke up this morning her right arm felt numb, tingly, and heavy. The symptoms have improved slightly. She is feeling anxious. Has the patient traveled out of the country within the last 30 days? ---Not Applicable Does the patient have any new or worsening symptoms? ---Yes Will a triage be completed? ---Yes Related visit to physician within the last 2 weeks? ---No Does the PT have any chronic conditions? (i.e. diabetes, asthma, etc.) ---No Is this a behavioral health or substance abuse call? ---No Guidelines Guideline Title Affirmed Question Affirmed Notes Neurologic Deficit [1] Numbness (i.e., loss of sensation) of the face, arm / hand, or leg / foot on one side of the body AND [2] sudden onset AND [3] brief (now gone) Final Disposition User Go to ED Now (or PCP triage) Ronnald Ramp, RN, Miranda Referrals GO TO FACILITY UNDECIDED Disagree/Comply: Comply

## 2016-03-25 NOTE — Telephone Encounter (Signed)
I spoke to patient. She states her symptoms of right arm tingling/numbness have gone and she "feels better". She states she is not going to emergency room and would like an an appointment with Dr. Yong Channel. I advised her he is gone for the day, but I can have scheduling make appointment. I advised her if her symptoms return or worsen to go to the closest ED or call 911. She voiced understanding.

## 2016-03-26 ENCOUNTER — Ambulatory Visit (INDEPENDENT_AMBULATORY_CARE_PROVIDER_SITE_OTHER)
Admission: RE | Admit: 2016-03-26 | Discharge: 2016-03-26 | Disposition: A | Discharge status: Home / Self Care | Payer: Medicare Other | Source: Ambulatory Visit | Attending: Family Medicine | Admitting: Family Medicine

## 2016-03-26 ENCOUNTER — Ambulatory Visit (INDEPENDENT_AMBULATORY_CARE_PROVIDER_SITE_OTHER): Payer: Medicare Other | Admitting: Family Medicine

## 2016-03-26 ENCOUNTER — Encounter: Payer: Self-pay | Admitting: Family Medicine

## 2016-03-26 VITALS — BP 122/82 | HR 92 | Temp 98.4°F | Wt 105.0 lb

## 2016-03-26 DIAGNOSIS — R202 Paresthesia of skin: Principal | ICD-10-CM

## 2016-03-26 DIAGNOSIS — R200 Anesthesia of skin: Secondary | ICD-10-CM

## 2016-03-26 DIAGNOSIS — M5030 Other cervical disc degeneration, unspecified cervical region: Secondary | ICD-10-CM | POA: Diagnosis not present

## 2016-03-26 DIAGNOSIS — G500 Trigeminal neuralgia: Secondary | ICD-10-CM | POA: Diagnosis not present

## 2016-03-26 MED ORDER — ATORVASTATIN CALCIUM 20 MG PO TABS: 20.0000 mg | ORAL_TABLET | Freq: Every day | ORAL | Status: DC

## 2016-03-26 MED ORDER — CARBAMAZEPINE ER 100 MG PO TB12: 100.0000 mg | ORAL_TABLET | Freq: Two times a day (BID) | ORAL | Status: DC

## 2016-03-26 NOTE — Assessment & Plan Note (Signed)
S: continues to have left facial pain and not taking medicine when she took carbamezepine briefly in past did have some resolution. Pain is so severe she does not feel like eating and has lost weight.  A/P: will restart carbamezepine- explained several times that last time the issue was backorder and encouraged her to try again and call me immediately if any issues.

## 2016-03-26 NOTE — Patient Instructions (Addendum)
We will call you within a week about your referral to MRI brain to rule out stroke. Call us if they have not heard within a week.   I also want you to get an x-ray of your neck to see if this could be coming from neck arthritis.   Finally in case it is a stroke- take cholesterol medicine that I sent in to reduce your risk  Finally for your left jaw pain- sent in carbamazepine again - let us know if you have issues filling this  Id ask you to check in with me a few days after your MRI to reevaluate- seek care if new or worsening symptoms

## 2016-03-26 NOTE — Progress Notes (Signed)
Subjective:  Brandy Conway is a 75 y.o. year old very pleasant female patient who presents for/with See problem oriented charting ROS- No facial or left sided extremity weakness (or RLE). No slurred words or trouble swallowing. no blurry vision or double vision.  No confusion or word finding difficulties.  Brandy Conway any ROS included in HPI as well.   Past Medical History-  Patient Active Problem List   Diagnosis Date Noted  . 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 CAP (community acquired pneumonia) 03/15/2015    Priority: Medium  . Hyperlipidemia 09/24/2014    Priority: Medium  . Trigeminal neuralgia 09/24/2014    Priority: Medium  . History of skin cancer     Priority: Low  . Claudication ([place]) 06/26/2015  . Lower extremity edema 06/26/2015  . Chest pain 06/07/2015  . Shortness of breath 04/10/2015    Medications- reviewed and updated Current Outpatient Prescriptions  Medication Sig Dispense Refill  . aspirin 325 MG tablet Take 325 mg by mouth daily.    . Multiple Vitamins-Minerals (ONE-A-DAY 50 PLUS PO) Take 1 tablet by mouth daily.     No current facility-administered medications for this visit.   Objective: BP 122/82 mmHg  Pulse 92  Temp(Src) 98.4 F (36.9 C) (Oral)  Wt 105 lb (47.628 kg)  SpO2 99% Gen: NAD, resting comfortably CV: RRR no murmurs rubs or gallops Lungs: CTAB no crackles, wheeze, rhonchi Abdomen: soft/nontender/nondistended/normal bowel sounds. No rebound or guarding.  Ext: no edema Skin: warm, dry Neuro: CN II-XII intact, sensation and reflexes normal throughout, 5/5 muscle strength in bilateral upper and lower extremities. Normal finger to nose. Normal rapid alternating movements. No pronator drift. Normal romberg. Normal gait.   Assessment/Plan:  Trigeminal neuralgia S: continues to have left facial pain and not taking medicine when she took carbamezepine briefly in past did have some resolution. Pain is so severe she does  not feel like eating and has lost weight.  A/P: will restart carbamezepine- explained several times that last time the issue was backorder and encouraged her to try again and call me immediately if any issues.   Numbness and tingling of right arm - Plan: MR Brain W Wo Contrast, DG Cervical Spine Complete S: Yesterday morning and when she woke up thought that arm was asleep but she was unable to wake it up. Feels weak. Brushing teeth felt like needed to hold it up. Has not had pain in it. Slightly better today- mainly just tingling but sensation and strength feels improved.  A/P: Concern is that this could represent CVA. Not TIA at this point as >24 hours.  Patient has known but untreated hyperlipidemia though mild- will start on atorvastatin to cover until MRI completed- will stop if MRI negative for CVA. Continue aspirin 325 for now though would have to consider plavix. If stroke, would get echo, carotid dopplers- for now hold on MRI. Also get cervical spine films as could be DDD related- consider prednisone in that case. Doubt PVD as cause- good pulses on right compared to weaker pulses in left with known brachial artery stenosis.   Strict Return precautions advised.   Orders Placed This Encounter  Procedures  . MR Brain W Wo Contrast    Standing Status: Future     Number of Occurrences:      Standing Expiration Date: 05/26/2017    Order Specific Question:  If indicated for the ordered procedure, I authorize the administration of contrast media per  Radiology protocol    Answer:  Yes    Order Specific Question:  Reason for Exam (SYMPTOM  OR DIAGNOSIS REQUIRED)    Answer:  right arm numbnes- rule out stroke    Order Specific Question:  Preferred imaging location?    Answer:  [facility] (table limit-550lbs)    Order Specific Question:  What is the patient's sedation requirement?    Answer:  No Sedation    Order Specific Question:  Does the patient have a pacemaker or implanted devices?     Answer:  No  . DG Cervical Spine Complete    03/26/16 LMOM TO SCHEDULE/CLC    Standing Status: Future     Number of Occurrences: 1     Standing Expiration Date: 05/26/2017    Order Specific Question:  Reason for Exam (SYMPTOM  OR DIAGNOSIS REQUIRED)    Answer:  right arm numbness- evaluate cervical arthritis    Order Specific Question:  Preferred imaging location?    Answer:  [facility]    Meds ordered this encounter  Medications  . carbamazepine (TEGRETOL-XR) 100 MG 12 hr tablet    Sig: Take 1 tablet (100 mg total) by mouth 2 (two) times daily.    Dispense:  60 tablet    Refill:  5  . atorvastatin (LIPITOR) 20 MG tablet    Sig: Take 1 tablet (20 mg total) by mouth daily.    Dispense:  30 tablet    Refill:  3    [name], MD

## 2016-04-06 ENCOUNTER — Ambulatory Visit
Admission: RE | Admit: 2016-04-06 | Discharge: 2016-04-06 | Disposition: A | Discharge status: Home / Self Care | Payer: Medicare Other | Source: Ambulatory Visit | Attending: Family Medicine | Admitting: Family Medicine

## 2016-04-06 DIAGNOSIS — R200 Anesthesia of skin: Secondary | ICD-10-CM | POA: Diagnosis not present

## 2016-04-06 DIAGNOSIS — R202 Paresthesia of skin: Principal | ICD-10-CM

## 2016-04-06 MED ORDER — GADOBENATE DIMEGLUMINE 529 MG/ML IV SOLN
10.0000 mL | Freq: Once | INTRAVENOUS | Status: AC | PRN
  Administered 2016-04-06: 10 mL via INTRAVENOUS

## 2016-04-21 ENCOUNTER — Ambulatory Visit (INDEPENDENT_AMBULATORY_CARE_PROVIDER_SITE_OTHER): Payer: Medicare Other | Admitting: Family Medicine

## 2016-04-21 ENCOUNTER — Encounter: Payer: Self-pay | Admitting: Family Medicine

## 2016-04-21 VITALS — BP 124/52 | HR 98 | Temp 99.2°F | Ht 68.0 in | Wt 105.0 lb

## 2016-04-21 DIAGNOSIS — T887XXA Unspecified adverse effect of drug or medicament, initial encounter: Secondary | ICD-10-CM

## 2016-04-21 DIAGNOSIS — G500 Trigeminal neuralgia: Secondary | ICD-10-CM | POA: Diagnosis not present

## 2016-04-21 DIAGNOSIS — T50905A Adverse effect of unspecified drugs, medicaments and biological substances, initial encounter: Secondary | ICD-10-CM

## 2016-04-21 LAB — CBC
HEMATOCRIT: 35.5 % — AB (ref 36.0–46.0)
Hemoglobin: 11.8 g/dL — ABNORMAL LOW (ref 12.0–15.0)
MCHC: 33.3 g/dL (ref 30.0–36.0)
MCV: 92 fl (ref 78.0–100.0)
Platelets: 241 10*3/uL (ref 150.0–400.0)
RBC: 3.85 Mil/uL — ABNORMAL LOW (ref 3.87–5.11)
RDW: 13.7 % (ref 11.5–15.5)
WBC: 4.1 10*3/uL (ref 4.0–10.5)

## 2016-04-21 LAB — COMPREHENSIVE METABOLIC PANEL
ALT: 15 U/L (ref 0–35)
AST: 20 U/L (ref 0–37)
Albumin: 3.9 g/dL (ref 3.5–5.2)
Alkaline Phosphatase: 41 U/L (ref 39–117)
BUN: 14 mg/dL (ref 6–23)
CHLORIDE: 105 meq/L (ref 96–112)
CO2: 30 meq/L (ref 19–32)
CREATININE: 0.49 mg/dL (ref 0.40–1.20)
Calcium: 9.3 mg/dL (ref 8.4–10.5)
GFR: 131 mL/min (ref >60.00)
GLUCOSE: 96 mg/dL (ref 70–99)
POTASSIUM: 3.8 meq/L (ref 3.5–5.1)
Sodium: 140 mEq/L (ref 135–145)
Total Bilirubin: 0.4 mg/dL (ref 0.2–1.2)
Total Protein: 6.7 g/dL (ref 6.0–8.3)

## 2016-04-21 NOTE — Assessment & Plan Note (Signed)
Medication side effect, initial encounter -  S:  Had taken carbamazepine for at least a week and was taking 2 a day- developed weakness generalized and felt like she couldn't walk. Very constipated. Was dizzy. Developed purplish spots in her skin, bruised in her fingers. Stopped taking the medicine and everything got better on once a day- then came all the way off and continued to improve.  A/P: Patient was concerned about stevens johnson syndrome- no skin breakdown so I doubt. DId have some purplish lesions on her body though that are improving- we will check Plan: CBC, Comprehensive metabolic panel. Carbamazepine was helping her pain from trigeminal neuralgia but SE intolerable obviously- listed as allergy. Patient has made decision to just tolerate symptoms instead of deal with other potential SE of other medications  We also reviewed prior right arm symptoms. No stroke on MRI. Appears cervical in origin but symptoms have now resolved- could trial prednisone in future if needed

## 2016-04-21 NOTE — Patient Instructions (Signed)
I wish we could find a medicine that would not cause you side effects and help your pain but we have not found that balance unfortunately  Remain just on aspirin and multivitamin  We are going to check labs before you leave  Glad no stroke, arm issues appeared to be coming from neck- if recurs we can try prednisone

## 2016-04-21 NOTE — Progress Notes (Signed)
Subjective:  Brandy Conway is a 75 y.o. year old very pleasant female patient who presents for/with See problem oriented charting ROS- no chest pain or shortness of breath..see any ROS included in HPI as well.   Past Medical History-  Patient Active Problem List   Diagnosis Date Noted  . 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 CAP (community acquired pneumonia) 03/15/2015    Priority: Medium  . Hyperlipidemia 09/24/2014    Priority: Medium  . Trigeminal neuralgia 09/24/2014    Priority: Medium  . History of skin cancer     Priority: Low  . Claudication ([place]) 06/26/2015  . Lower extremity edema 06/26/2015  . Shortness of breath 04/10/2015    Medications- reviewed and updated Current Outpatient Prescriptions  Medication Sig Dispense Refill  . aspirin 325 MG tablet Take 325 mg by mouth daily.    . Multiple Vitamins-Minerals (ONE-A-DAY 50 PLUS PO) Take 1 tablet by mouth daily.     No current facility-administered medications for this visit.    Objective: BP 124/52 mmHg  Pulse 98  Temp(Src) 99.2 F (37.3 C) (Oral)  Ht 5\' 8"  (1.727 m)  Wt 105 lb (47.628 kg)  BMI 15.97 kg/m2  SpO2 97% Gen: NAD, resting comfortably CV: RRR no murmurs rubs or gallops Lungs: CTAB no crackles, wheeze, rhonchi Abdomen: soft/nontender/nondistended/normal bowel sounds.  Ext: no edema, wears compression stockings wth history varicose veins Skin: warm, dry Neuro: grossly normal, moves all extremities  Assessment/Plan:   Trigeminal neuralgia Medication side effect, initial encounter -  S:  Had taken carbamazepine for at least a week and was taking 2 a day- developed weakness generalized and felt like she couldn't walk. Very constipated. Was dizzy. Developed purplish spots in her skin, bruised in her fingers. Stopped taking the medicine and everything got better on once a day- then came all the way off and continued to improve.  A/P: Patient was concerned about stevens  johnson syndrome- no skin breakdown so I doubt. DId have some purplish lesions on her body though that are improving- we will check Plan: CBC, Comprehensive metabolic panel. Carbamazepine was helping her pain from trigeminal neuralgia but SE intolerable obviously- listed as allergy. Patient has made decision to just tolerate symptoms instead of deal with other potential SE of other medications  We also reviewed prior right arm symptoms. No stroke on MRI. Appears cervical in origin but symptoms have now resolved- could trial prednisone in future if needed    Orders Placed This Encounter  Procedures  . CBC    [place]  . Comprehensive metabolic panel    [place]   The duration of face-to-face time during this visit was 15 minutes. Greater than 50% of this time was spent in counseling, explanation of diagnosis, planning of further management, and/or coordination of care.    Return precautions advised.  [name], MD

## 2016-04-21 NOTE — Progress Notes (Signed)
Pre visit review using our clinic review tool, if applicable. No additional management support is needed unless otherwise documented below in the visit note. 

## 2016-05-22 ENCOUNTER — Other Ambulatory Visit: Payer: Self-pay | Admitting: Family Medicine

## 2016-05-22 DIAGNOSIS — Z1231 Encounter for screening mammogram for malignant neoplasm of breast: Secondary | ICD-10-CM

## 2016-05-28 NOTE — Progress Notes (Addendum)
Subjective:   Brandy Conway is a 75 y.o. female who presents for Medicare Annual (Subsequent) preventive examination.  HRA assessment completed during this visit with Brandy Conway  The Patient was informed that the wellness visit is to identify future health risk and educate and initiate measures that can reduce risk for increased disease through the lifespan.    NO ROS; Medicare Wellness Visit Last OV:  04/2016 Labs completed: 04/21/2016/ glucose 96   Psychosocial: (family hx HD; Asthma; HTN; Cancer of liver and son had brain tumor)  Former smoker; cig; .5 pack x 16 years;  8 pack year /  ETOH rare  Widowed in March 2015-sick for a long time. 1 son- lost at age 38.5 sudden death-malignant brain tumor ruptured. No pets.  Hobbies: lives at [facility] (retirement community)-exercise, playing bingo, reading Facial nerve sensory disorder  Glad to be living close relatives  Medications reviewed for issues; compliance; otc meds  BMI: 16    Diet;  pain from trigeminal neuralgia; eatin triggers pain Nutritional counseling given: discussed eating soft foods, shakes etc and states it is not the food itself but swollowing triggers the pain; tries soft foods but anything hurts; Pain is significant but only last a minute or two.   Teeth or Denture issues? Hx of a lot of dental work in the past   Exercise: States she likes to walk and will go outside and walk to reduce her stress;    [place] lives with niece currently; [address] apt. Can cook,  Has a shower with a seat  Issues reviewed that may potentiate risk include multilevel or inaccessible homes or long term plan? One level  Personal safety issues reviewed for risk such as safe community; smoke Secondary school teacher; firearms safety if applicable; protection when in the sun; driving safety for seniors or any recent accidents. Any accidents Fall hx; no   UA or BOWEL incontinence; no Functional losses from last year to this year? Secondary to  pain Given education on "Fall Prevention in the Home" for more safety tips the patient can apply as appropriate.   Risk for Depression reviewed: Any emotional problems? Anxious, depressed, irritable, sad or blue? This week she has felt nervous and depressed but states walking helps her Denies feeling depressed or hopeless; voices pleasure in daily life  Who would help you with chores; illness; shopping other? Lives with niece   Sleeps well  Cognitive; no issues / no issues Manages checkbook, medications; no failures of task Ad8 score reviewed for issues;  Issues making decisions; no  Less interest in hobbies / activities" no  Repeats questions, stories; family complaining: NO  Trouble using ordinary gadgets; microwave; computer: no  Forgets the month or year: no  Mismanaging finances: no  Missing apt: no but does write them down  Daily problems with thinking of memory NO Ad8 score is 0   Advanced Directive addressed; yes; she has completed   Counseling Health Maintenance Gaps:  Colonoscopy; had one per Dr. Arnoldo Morale; HM due date is 03/2020 EKG:  04/18/2015; heart doctor checked her last year /Mammogram: 05/2015/ apt next month for annual  Dexa/ has not had and will consider for the future PAP: s/p  Abd Hyst  Hearing: need hearing test but does not want apt now Will go to audiologist close to home when she decides to go  Will fup dept of [facility] for free hearing aid   Ophthalmology exam; need to schedule eye exam  Early cataracts  Immunizations Due: (Vaccines reviewed and educated regarding any overdue)  Tdap or TD needed; declines now; Educated that medicare will pay if she has a dirty cut; etc;  Chooses to postpone Zostavax: declines for today but educated  PSV 23- Taken 09/02/2006 and the same month, she would have been 65 in 19 days. Will defer PSV 23; had a couple of week prior to 65th birthday and declines today  Established and updated Risk reviewed and  appropriate referral made or health recommendations:  Current Care Team reviewed and updated       Objective:     Vitals: BP 130/60   Pulse 84   Ht 5\' 6"  (1.676 m)   Wt 100 lb 8 oz (45.6 kg)   SpO2 97%   BMI 16.22 kg/m   Body mass index is 16.22 kg/m.   Tobacco History  Smoking Status  . Former Smoker  . Packs/day: 0.50  . Years: 16.00  . Types: Cigarettes  . Quit date: 11/02/1978  Smokeless Tobacco  . Not on file     Counseling given: Yes   Past Medical History:  Diagnosis Date  . Allergy   . Cancer ([place])   . Facial nerve sensory disorder   . History of skin cancer    Past Surgical History:  Procedure Laterality Date  . ABDOMINAL HYSTERECTOMY    . TONSILLECTOMY AND ADENOIDECTOMY     Family History  Problem Relation Age of Onset  . Heart disease Mother     CHF  . Heart disease Brother 75    stents  . Asthma Maternal Grandmother   . Hypertension Maternal Grandmother   . Hypertension Paternal Grandmother   . Cancer Paternal Grandfather     liver  . Cancer Son     brain    History  Sexual Activity  . Sexual activity: No    Outpatient Encounter Prescriptions as of 05/29/2016  Medication Sig  . aspirin 325 MG tablet Take 325 mg by mouth daily.  . Multiple Vitamins-Minerals (ONE-A-DAY 50 PLUS PO) Take 1 tablet by mouth daily.   No facility-administered encounter medications on file as of 05/29/2016.     Activities of Daily Living In your present state of health, do you have any difficulty performing the following activities: 05/29/2016  Hearing? Y  Vision? (No Data)  Difficulty concentrating or making decisions? N  Walking or climbing stairs? N  Dressing or bathing? N  Doing errands, shopping? N  Preparing Food and eating ? N  Using the Toilet? N  In the past six months, have you accidently leaked urine? N  Do you have problems with loss of bowel control? N  Managing your Medications? N  Managing your Finances? N  Housekeeping or managing  your Housekeeping? N  Some recent data might be hidden    Patient Care Team: Marin Olp, MD as PCP - General (Family Medicine)    Assessment:      Nutritional Counseling; needs to gain weight; Suggested options; add honey; protein powder to yogurt to try and get more calories Discussed fup to try other meds To get hearing screen  To have eye exam  Osteopenia; did agree to Dexa scan    Tobacco use; low use ; quit many smoking    Barriers to Success pain    Exercise Activities and Dietary recommendations Current Exercise Habits: Home exercise routine, Type of exercise: walking, Time (Minutes): 45, Frequency (Times/Week): 6 (stationary bike x several miles), Weekly Exercise (Minutes/Week): 270, Intensity:  Mild  Goals    . patient          Wants her pain controled;        Fall Risk Fall Risk  05/29/2016 12/27/2015 09/24/2014  Falls in the past year? No No No   Depression Screen PHQ 2/9 Scores 05/29/2016 12/27/2015 09/24/2014  PHQ - 2 Score 0 0 1    Denies depression;   Cognitive Testing MMSE - Mini Mental State Exam 05/29/2016  Not completed: (No Data)   Ad8 score is 0  Immunization History  Administered Date(s) Administered  . Influenza Whole 08/08/2007, 07/16/2010  . Influenza,inj,Quad PF,36+ Mos 07/19/2013  . Influenza-Unspecified 08/11/2014, 08/12/2015  . Pneumococcal Conjugate-13 09/24/2014  . Pneumococcal-Unspecified 09/02/2006   Screening Tests Health Maintenance  Topic Date Due  . TETANUS/TDAP  09/21/1960  . ZOSTAVAX  09/21/2001  . PNA vac Low Risk Adult (2 of 2 - PPSV23) 09/25/2015  . DEXA SCAN  03/14/2020 (Originally 09/21/2006)  . COLONOSCOPY  03/14/2020 (Originally 09/22/1991)  . INFLUENZA VACCINE  06/02/2016  . MAMMOGRAM  05/23/2017      Plan:   Opposed to pain med due to side effects;  But bad week this week; BMI 16; Discussed other med options.  Will try to eat more or add more calories   During the course of the visit the  patient was educated and counseled about the following appropriate screening and preventive services:   Vaccines to include Pneumoccal, Influenza, Hepatitis B, Td, Zostavax, HCV/ declines today   Had PSV 23 - several weeks prior to turning 65; declines today  Deferred TD /Tdap  Electrocardiogram followed last year  Cardiovascular Disease/ deferred   Colorectal cancer screening/ declined any further  Bone density screening- ordered scan today  Diabetes screening/neg  Glaucoma screening/ will schedule eye exam  Mammography/ already scheduled next month  Nutrition counseling / try to gain weight  Patient Instructions (the written plan) was given to the patient.   [id number], RN  05/30/2016  I have reviewed and agree with note, evaluation, plan. Could certainly benefit from additional calories.   [name], MD

## 2016-05-29 ENCOUNTER — Ambulatory Visit (INDEPENDENT_AMBULATORY_CARE_PROVIDER_SITE_OTHER): Payer: Medicare Other

## 2016-05-29 VITALS — BP 130/60 | HR 84 | Ht 66.0 in | Wt 100.5 lb

## 2016-05-29 DIAGNOSIS — Z0000 Encounter for general adult medical examination without abnormal findings: Secondary | ICD-10-CM | POA: Diagnosis not present

## 2016-05-29 DIAGNOSIS — Z780 Asymptomatic menopausal state: Secondary | ICD-10-CM

## 2016-05-29 NOTE — Patient Instructions (Addendum)
Brandy Conway , Thank you for taking time to come for your Medicare Wellness Visit. I appreciate your ongoing commitment to your health goals. Please review the following plan we discussed and let me know if I can assist you in the future.   Keep eating vegetables but try to add honey, butter  Can try to add powder drinks with fat, carb and protein powder to foods Add protein Powder into yogurt   [facility] - call for information on a free hearing aid  No reviews  [phone]  [phone]  To schedule eye exam  Agrees to have dexa scan  Will get a Tetanus if you cut yourself or other scratch   These are the goals we discussed: Goals    . patient          Wants her pain controled;         This is a list of the screening recommended for you and due dates:  Health Maintenance  Topic Date Due  . Tetanus Vaccine  09/21/1960  . Shingles Vaccine  09/21/2001  . Pneumonia vaccines (2 of 2 - PPSV23) 09/25/2015  . DEXA scan (bone density measurement)  03/14/2020*  . Colon Cancer Screening  03/14/2020*  . Flu Shot  06/02/2016  . Mammogram  05/23/2017  *Topic was postponed. The date shown is not the original due date.       Fall Prevention in the Home  Falls can cause injuries. They can happen to people of all ages. There are many things you can do to make your home safe and to help prevent falls.  WHAT CAN I DO ON THE OUTSIDE OF MY HOME?  Regularly fix the edges of walkways and driveways and fix any cracks.  Remove anything that might make you trip as you walk through a door, such as a raised step or threshold.  Trim any bushes or trees on the path to your home.  Use bright outdoor lighting.  Clear any walking paths of anything that might make someone trip, such as rocks or tools.  Regularly check to see if handrails are loose or broken. Make sure that both sides of any steps have handrails.  Any raised decks  and porches should have guardrails on the edges.  Have any leaves, snow, or ice cleared regularly.  Use sand or salt on walking paths during winter.  Clean up any spills in your garage right away. This includes oil or grease spills. WHAT CAN I DO IN THE BATHROOM?   Use night lights.  Install grab bars by the toilet and in the tub and shower. Do not use towel bars as grab bars.  Use non-skid mats or decals in the tub or shower.  If you need to sit down in the shower, use a plastic, non-slip stool.  Keep the floor dry. Clean up any water that spills on the floor as soon as it happens.  Remove soap buildup in the tub or shower regularly.  Attach bath mats securely with double-sided non-slip rug tape.  Do not have throw rugs and other things on the floor that can make you trip. WHAT CAN I DO IN THE BEDROOM?  Use night lights.  Make sure that you have a light by your bed that is easy to reach.  Do not use any sheets or blankets that are too big for your bed. They should not hang down onto  the floor.  Have a firm chair that has side arms. You can use this for support while you get dressed.  Do not have throw rugs and other things on the floor that can make you trip. WHAT CAN I DO IN THE KITCHEN?  Clean up any spills right away.  Avoid walking on wet floors.  Keep items that you use a lot in easy-to-reach places.  If you need to reach something above you, use a strong step stool that has a grab bar.  Keep electrical cords out of the way.  Do not use floor polish or wax that makes floors slippery. If you must use wax, use non-skid floor wax.  Do not have throw rugs and other things on the floor that can make you trip. WHAT CAN I DO WITH MY STAIRS?  Do not leave any items on the stairs.  Make sure that there are handrails on both sides of the stairs and use them. Fix handrails that are broken or loose. Make sure that handrails are as long as the stairways.  Check any  carpeting to make sure that it is firmly attached to the stairs. Fix any carpet that is loose or worn.  Avoid having throw rugs at the top or bottom of the stairs. If you do have throw rugs, attach them to the floor with carpet tape.  Make sure that you have a light switch at the top of the stairs and the bottom of the stairs. If you do not have them, ask someone to add them for you. WHAT ELSE CAN I DO TO HELP PREVENT FALLS?  Wear shoes that:  Do not have high heels.  Have rubber bottoms.  Are comfortable and fit you well.  Are closed at the toe. Do not wear sandals.  If you use a stepladder:  Make sure that it is fully opened. Do not climb a closed stepladder.  Make sure that both sides of the stepladder are locked into place.  Ask someone to hold it for you, if possible.  Clearly mark and make sure that you can see:  Any grab bars or handrails.  First and last steps.  Where the edge of each step is.  Use tools that help you move around (mobility aids) if they are needed. These include:  Canes.  Walkers.  Scooters.  Crutches.  Turn on the lights when you go into a dark area. Replace any light bulbs as soon as they burn out.  Set up your furniture so you have a clear path. Avoid moving your furniture around.  If any of your floors are uneven, fix them.  If there are any pets around you, be aware of where they are.  Review your medicines with your doctor. Some medicines can make you feel dizzy. This can increase your chance of falling. Ask your doctor what other things that you can do to help prevent falls.   This information is not intended to replace advice given to you by your health care provider. Make sure you discuss any questions you have with your health care provider.   Document Released: 08/15/2009 Document Revised: 03/05/2015 Document Reviewed: 11/23/2014 Elsevier Interactive Patient Education 2016 [place] Maintenance,  Female Adopting a healthy lifestyle and getting preventive care can go a long way to promote health and wellness. Talk with your health care provider about what schedule of regular examinations is right for you. This is a good chance for you to check in with  your provider about disease prevention and staying healthy. In between checkups, there are plenty of things you can do on your own. Experts have done a lot of research about which lifestyle changes and preventive measures are most likely to keep you healthy. Ask your health care provider for more information. WEIGHT AND DIET  Eat a healthy diet  Be sure to include plenty of vegetables, fruits, low-fat dairy products, and lean protein.  Do not eat a lot of foods high in solid fats, added sugars, or salt.  Get regular exercise. This is one of the most important things you can do for your health.  Most adults should exercise for at least 150 minutes each week. The exercise should increase your heart rate and make you sweat (moderate-intensity exercise).  Most adults should also do strengthening exercises at least twice a week. This is in addition to the moderate-intensity exercise.  Maintain a healthy weight  Body mass index (BMI) is a measurement that can be used to identify possible weight problems. It estimates body fat based on height and weight. Your health care provider can help determine your BMI and help you achieve or maintain a healthy weight.  For females 19 years of age and older:   A BMI below 18.5 is considered underweight.  A BMI of 18.5 to 24.9 is normal.  A BMI of 25 to 29.9 is considered overweight.  A BMI of 30 and above is considered obese.  Watch levels of cholesterol and blood lipids  You should start having your blood tested for lipids and cholesterol at 75 years of age, then have this test every 5 years.  You may need to have your cholesterol levels checked more often if:  Your lipid or cholesterol levels  are high.  You are older than 75 years of age.  You are at high risk for heart disease.  CANCER SCREENING   Lung Cancer  Lung cancer screening is recommended for adults 5-84 years old who are at high risk for lung cancer because of a history of smoking.  A yearly low-dose CT scan of the lungs is recommended for people who:  Currently smoke.  Have quit within the past 15 years.  Have at least a 30-pack-year history of smoking. A pack year is smoking an average of one pack of cigarettes a day for 1 year.  Yearly screening should continue until it has been 15 years since you quit.  Yearly screening should stop if you develop a health problem that would prevent you from having lung cancer treatment.  Breast Cancer  Practice breast self-awareness. This means understanding how your breasts normally appear and feel.  It also means doing regular breast self-exams. Let your health care provider know about any changes, no matter how small.  If you are in your 20s or 30s, you should have a clinical breast exam (CBE) by a health care provider every 1-3 years as part of a regular health exam.  If you are 41 or older, have a CBE every year. Also consider having a breast X-ray (mammogram) every year.  If you have a family history of breast cancer, talk to your health care provider about genetic screening.  If you are at high risk for breast cancer, talk to your health care provider about having an MRI and a mammogram every year.  Breast cancer gene (BRCA) assessment is recommended for women who have family members with BRCA-related cancers. BRCA-related cancers include:  Breast.  Ovarian.  Tubal.  Peritoneal cancers.  Results of the assessment will determine the need for genetic counseling and BRCA1 and BRCA2 testing. Cervical Cancer Your health care provider may recommend that you be screened regularly for cancer of the pelvic organs (ovaries, uterus, and vagina). This screening  involves a pelvic examination, including checking for microscopic changes to the surface of your cervix (Pap test). You may be encouraged to have this screening done every 3 years, beginning at age 21.  For women ages 30-65, health care providers may recommend pelvic exams and Pap testing every 3 years, or they may recommend the Pap and pelvic exam, combined with testing for human papilloma virus (HPV), every 5 years. Some types of HPV increase your risk of cervical cancer. Testing for HPV may also be done on women of any age with unclear Pap test results.  Other health care providers may not recommend any screening for nonpregnant women who are considered low risk for pelvic cancer and who do not have symptoms. Ask your health care provider if a screening pelvic exam is right for you.  If you have had past treatment for cervical cancer or a condition that could lead to cancer, you need Pap tests and screening for cancer for at least 20 years after your treatment. If Pap tests have been discontinued, your risk factors (such as having a new sexual partner) need to be reassessed to determine if screening should resume. Some women have medical problems that increase the chance of getting cervical cancer. In these cases, your health care provider may recommend more frequent screening and Pap tests. Colorectal Cancer  This type of cancer can be detected and often prevented.  Routine colorectal cancer screening usually begins at 75 years of age and continues through 75 years of age.  Your health care provider may recommend screening at an earlier age if you have risk factors for colon cancer.  Your health care provider may also recommend using home test kits to check for hidden blood in the stool.  A small camera at the end of a tube can be used to examine your colon directly (sigmoidoscopy or colonoscopy). This is done to check for the earliest forms of colorectal cancer.  Routine screening usually  begins at age 50.  Direct examination of the colon should be repeated every 5-10 years through 75 years of age. However, you may need to be screened more often if early forms of precancerous polyps or small growths are found. Skin Cancer  Check your skin from head to toe regularly.  Tell your health care provider about any new moles or changes in moles, especially if there is a change in a mole's shape or color.  Also tell your health care provider if you have a mole that is larger than the size of a pencil eraser.  Always use sunscreen. Apply sunscreen liberally and repeatedly throughout the day.  Protect yourself by wearing long sleeves, pants, a wide-brimmed hat, and sunglasses whenever you are outside. HEART DISEASE, DIABETES, AND HIGH BLOOD PRESSURE   High blood pressure causes heart disease and increases the risk of stroke. High blood pressure is more likely to develop in:  People who have blood pressure in the high end of the normal range (130-139/85-89 mm Hg).  People who are overweight or obese.  People who are African American.  If you are 18-39 years of age, have your blood pressure checked every 3-5 years. If you are 40 years of age or older, have your blood pressure   checked every year. You should have your blood pressure measured twice--once when you are at a hospital or clinic, and once when you are not at a hospital or clinic. Record the average of the two measurements. To check your blood pressure when you are not at a hospital or clinic, you can use:  An automated blood pressure machine at a pharmacy.  A home blood pressure monitor.  If you are between 55 years and 79 years old, ask your health care provider if you should take aspirin to prevent strokes.  Have regular diabetes screenings. This involves taking a blood sample to check your fasting blood sugar level.  If you are at a normal weight and have a low risk for diabetes, have this test once every three years  after 75 years of age.  If you are overweight and have a high risk for diabetes, consider being tested at a younger age or more often. PREVENTING INFECTION  Hepatitis B  If you have a higher risk for hepatitis B, you should be screened for this virus. You are considered at high risk for hepatitis B if:  You were born in a country where hepatitis B is common. Ask your health care provider which countries are considered high risk.  Your parents were born in a high-risk country, and you have not been immunized against hepatitis B (hepatitis B vaccine).  You have HIV or AIDS.  You use needles to inject street drugs.  You live with someone who has hepatitis B.  You have had sex with someone who has hepatitis B.  You get hemodialysis treatment.  You take certain medicines for conditions, including cancer, organ transplantation, and autoimmune conditions. Hepatitis C  Blood testing is recommended for:  Everyone born from 1945 through 1965.  Anyone with known risk factors for hepatitis C. Sexually transmitted infections (STIs)  You should be screened for sexually transmitted infections (STIs) including gonorrhea and chlamydia if:  You are sexually active and are younger than 75 years of age.  You are older than 75 years of age and your health care provider tells you that you are at risk for this type of infection.  Your sexual activity has changed since you were last screened and you are at an increased risk for chlamydia or gonorrhea. Ask your health care provider if you are at risk.  If you do not have HIV, but are at risk, it may be recommended that you take a prescription medicine daily to prevent HIV infection. This is called pre-exposure prophylaxis (PrEP). You are considered at risk if:  You are sexually active and do not regularly use condoms or know the HIV status of your partner(s).  You take drugs by injection.  You are sexually active with a partner who has  HIV. Talk with your health care provider about whether you are at high risk of being infected with HIV. If you choose to begin PrEP, you should first be tested for HIV. You should then be tested every 3 months for as long as you are taking PrEP.  PREGNANCY   If you are premenopausal and you may become pregnant, ask your health care provider about preconception counseling.  If you may become pregnant, take 400 to 800 micrograms (mcg) of folic acid every day.  If you want to prevent pregnancy, talk to your health care provider about birth control (contraception). OSTEOPOROSIS AND MENOPAUSE   Osteoporosis is a disease in which the bones lose minerals and strength with aging.   This can result in serious bone fractures. Your risk for osteoporosis can be identified using a bone density scan.  If you are 47 years of age or older, or if you are at risk for osteoporosis and fractures, ask your health care provider if you should be screened.  Ask your health care provider whether you should take a calcium or vitamin D supplement to lower your risk for osteoporosis.  Menopause may have certain physical symptoms and risks.  Hormone replacement therapy may reduce some of these symptoms and risks. Talk to your health care provider about whether hormone replacement therapy is right for you.  HOME CARE INSTRUCTIONS   Schedule regular health, dental, and eye exams.  Stay current with your immunizations.   Do not use any tobacco products including cigarettes, chewing tobacco, or electronic cigarettes.  If you are pregnant, do not drink alcohol.  If you are breastfeeding, limit how much and how often you drink alcohol.  Limit alcohol intake to no more than 1 drink per day for nonpregnant women. One drink equals 12 ounces of beer, 5 ounces of wine, or 1 ounces of hard liquor.  Do not use street drugs.  Do not share needles.  Ask your health care provider for help if you need support or  information about quitting drugs.  Tell your health care provider if you often feel depressed.  Tell your health care provider if you have ever been abused or do not feel safe at home.   This information is not intended to replace advice given to you by your health care provider. Make sure you discuss any questions you have with your health care provider.   Document Released: 05/04/2011 Document Revised: 11/09/2014 Document Reviewed: 09/20/2013 Elsevier Interactive Patient Education 2016 [employer].   Hearing Loss Hearing loss is a partial or total loss of the ability to hear. This can be temporary or permanent, and it can happen in one or both ears. Hearing loss may be referred to as deafness. Medical care is necessary to treat hearing loss properly and to prevent the condition from getting worse. Your hearing may partially or completely come back, depending on what caused your hearing loss and how severe it is. In some cases, hearing loss is permanent. CAUSES Common causes of hearing loss include:   Too much wax in the ear canal.   Infection of the ear canal or middle ear.   Fluid in the middle ear.   Injury to the ear or surrounding area.   An object stuck in the ear.   Prolonged exposure to loud sounds, such as music.  Less common causes of hearing loss include:   Tumors in the ear.   Viral or bacterial infections, such as meningitis.   A hole in the eardrum (perforated eardrum).  Problems with the hearing nerve that sends signals between the brain and the ear.  Certain medicines.  SYMPTOMS  Symptoms of this condition may include:  Difficulty telling the difference between sounds.  Difficulty following a conversation when there is background noise.  Lack of response to sounds in your environment. This may be most noticeable when you do not respond to startling sounds.  Needing to turn up the volume on the television, radio, etc.  Ringing in the  ears.  Dizziness.  Pain in the ears. DIAGNOSIS This condition is diagnosed based on a physical exam and a hearing test (audiometry). The audiometry test will be performed by a hearing specialist (audiologist). You may also  be referred to an ear, nose, and throat (ENT) specialist (otolaryngologist).  TREATMENT Treatment for recent onset of hearing loss may include:   Ear wax removal.   Being prescribed medicines to prevent infection (antibiotics).   Being prescribed medicines to reduce inflammation (corticosteroids).  HOME CARE INSTRUCTIONS  If you were prescribed an antibiotic medicine, take it as told by your health care provider. Do not stop taking the antibiotic even if you start to feel better.  Take over-the-counter and prescription medicines only as told by your health care provider.  Avoid loud noises.   Return to your normal activities as told by your health care provider. Ask your health care provider what activities are safe for you.  Keep all follow-up visits as told by your health care provider. This is important. SEEK MEDICAL CARE IF:   You feel dizzy.   You develop new symptoms.   You vomit or feel nauseous.   You have a fever.  SEEK IMMEDIATE MEDICAL CARE IF:  You develop sudden changes in your vision.   You have severe ear pain.   You have new or increased weakness.  You have a severe headache.   This information is not intended to replace advice given to you by your health care provider. Make sure you discuss any questions you have with your health care provider.   Document Released: 10/19/2005 Document Revised: 07/10/2015 Document Reviewed: 03/06/2015 Elsevier Interactive Patient Education [employer].

## 2016-07-02 ENCOUNTER — Ambulatory Visit
Admission: RE | Admit: 2016-07-02 | Discharge: 2016-07-02 | Disposition: A | Discharge status: Home / Self Care | Payer: Medicare Other | Source: Ambulatory Visit | Attending: Family Medicine | Admitting: Family Medicine

## 2016-07-02 DIAGNOSIS — D1801 Hemangioma of skin and subcutaneous tissue: Secondary | ICD-10-CM | POA: Diagnosis not present

## 2016-07-02 DIAGNOSIS — Z1231 Encounter for screening mammogram for malignant neoplasm of breast: Secondary | ICD-10-CM | POA: Diagnosis not present

## 2016-07-02 DIAGNOSIS — Z85828 Personal history of other malignant neoplasm of skin: Secondary | ICD-10-CM | POA: Diagnosis not present

## 2016-07-02 DIAGNOSIS — L814 Other melanin hyperpigmentation: Secondary | ICD-10-CM | POA: Diagnosis not present

## 2016-07-02 DIAGNOSIS — L821 Other seborrheic keratosis: Secondary | ICD-10-CM | POA: Diagnosis not present

## 2016-07-21 ENCOUNTER — Ambulatory Visit (INDEPENDENT_AMBULATORY_CARE_PROVIDER_SITE_OTHER): Payer: Medicare Other | Admitting: Family Medicine

## 2016-07-21 ENCOUNTER — Encounter: Payer: Self-pay | Admitting: Family Medicine

## 2016-07-21 VITALS — BP 102/86 | HR 73 | Temp 98.3°F | Wt 101.8 lb

## 2016-07-21 DIAGNOSIS — Z23 Encounter for immunization: Secondary | ICD-10-CM

## 2016-07-21 DIAGNOSIS — M7989 Other specified soft tissue disorders: Secondary | ICD-10-CM

## 2016-07-21 DIAGNOSIS — D649 Anemia, unspecified: Secondary | ICD-10-CM

## 2016-07-21 DIAGNOSIS — G500 Trigeminal neuralgia: Secondary | ICD-10-CM | POA: Diagnosis not present

## 2016-07-21 DIAGNOSIS — R21 Rash and other nonspecific skin eruption: Secondary | ICD-10-CM

## 2016-07-21 LAB — CBC
HCT: 35.9 % — ABNORMAL LOW (ref 36.0–46.0)
Hemoglobin: 12 g/dL (ref 12.0–15.0)
MCHC: 33.4 g/dL (ref 30.0–36.0)
MCV: 92.5 fl (ref 78.0–100.0)
PLATELETS: 248 10*3/uL (ref 150.0–400.0)
RBC: 3.88 Mil/uL (ref 3.87–5.11)
RDW: 13.8 % (ref 11.5–15.5)
WBC: 3.9 10*3/uL — AB (ref 4.0–10.5)

## 2016-07-21 NOTE — Progress Notes (Signed)
Pre visit review using our clinic review tool, if applicable. No additional management support is needed unless otherwise documented below in the visit note. 

## 2016-07-21 NOTE — Progress Notes (Signed)
Subjective:  Brandy Conway is a 75 y.o. year old very pleasant female patient who presents for/with See problem oriented charting ROS- continued intermittent left facial pain, noted swelling/pain right lower leg.easy brusiing. no chest pain or shortness of breath. see any ROS included in HPI as well.   Past Medical History-  Patient Active Problem List   Diagnosis Date Noted  . 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 CAP (community acquired pneumonia) 03/15/2015    Priority: Medium  . Hyperlipidemia 09/24/2014    Priority: Medium  . Trigeminal neuralgia 09/24/2014    Priority: Medium  . History of skin cancer     Priority: Low  . Claudication ([place]) 06/26/2015  . Lower extremity edema 06/26/2015  . Shortness of breath 04/10/2015    Medications- reviewed and updated Current Outpatient Prescriptions  Medication Sig Dispense Refill  . aspirin 325 MG tablet Take 325 mg by mouth daily.    . Multiple Vitamins-Minerals (ONE-A-DAY 50 PLUS PO) Take 1 tablet by mouth daily.     No current facility-administered medications for this visit.     Objective: BP 102/86 (BP Location: Left Arm, Patient Position: Sitting, Cuff Size: Small)   Pulse 73   Temp 98.3 F (36.8 C) (Oral)   Wt 101 lb 12.8 oz (46.2 kg)   SpO2 99%   BMI 16.43 kg/m  Gen: NAD, resting comfortably CV: RRR no murmurs rubs or gallops Lungs: CTAB no crackles, wheeze, rhonchi Abdomen: soft/nontender/nondistended/normal bowel sounds. No rebound or guarding.  Ext: no edema except for focal edema in left lower leg 5 x 4 cm without clear warmth or erythema though has some venous stasis changes and skin discoloaration Skin: warm, dry Neuro: grossly normal, moves all extremities, appears uncomfortable at times in tears grasping left side of face   Assessment/Plan:  Drug reaction Rash Right calf pain Trigeminal neuralgia Anemia S: Patient was seen last time after moving up to 2 a day of  carbamazepine. She felt that she was weak and couldn't talk normally and was dizzy- listed as allergy at the time. She also felt like she started to bruise far more especially in her fingers though not finger tips. She stated everything improved back on once a day and then that she stopped and issue completely resolved. She has just dealt with the intermittent pain of trigeminal neuralgia since stopping medicine- she does not want to try anything else for this. She was also noted to have mild anemia with hgb 11.8 at last visit.   Today, patient states that issue has in fact not completely resolved. She continues to have easy bruising (but does take aspirin daily). She also since that time notes small red painful spots that come as quickly as they resolve on arms and chest. She saw dermatology and they stated no concern. Finally she states since last visit she has noted swelling and pain in distal lateral right lower leg. This has been present for at least a month and the only thing that controls tenderness is compression hose.  A/P: Drug reaction- no longer having reaction from carbamezepine. Explained the easy bruising is likely age related as she was also told at dermatology. Unclear what causes the red painful bumps but they resolve on their own or with some cream she was given from dermatology- likely steroid and can continue to monitor  Swollen area right calf- get venous duplex to rule out DVT- could be phelbitis. May be reasonable to trial  heat or cold, short term nsaids depending on findings  Trigeminal neuralgia- wants to just deal with pain given concern of SE  Anemia- resolved on CBC but now mild WBC decrease at 3.9- will monitor. Will get stool cards as well as has declined colonoscopy  Orders Placed This Encounter  Procedures  . Flu Vaccine QUAD 36+ mos IM  . CBC    [place]  . POC Hemoccult Bld/Stl (3-Cd Home Screen)    Send home    Standing Status:   Future    Standing Expiration  Date:   07/21/2017   The duration of face-to-face time during this visit was greater than 25 minutes. Greater than 50% of this time was spent in counseling as dealing with above issues has been very stressful for her, also counseling on importance of next steps  Return precautions advised.  [name], MD

## 2016-07-21 NOTE — Patient Instructions (Addendum)
Glad you saw dermatology for the spots- sorry there was little they could do  Sorry we do not have a good answer for your facial pain since you are concerned about side effects of other medicines  For swollen spot on leg- try to get scan today- sit tight and we will bring info on the scan to you  Regular dose flu shot per your request  labwork before you leave to recheck anemia- also pick up stool cards and send them back to use

## 2016-07-29 ENCOUNTER — Ambulatory Visit (HOSPITAL_COMMUNITY)
Admission: RE | Admit: 2016-07-29 | Discharge: 2016-07-29 | Disposition: A | Discharge status: Home / Self Care | Payer: Medicare Other | Source: Ambulatory Visit | Attending: Family Medicine | Admitting: Family Medicine

## 2016-07-29 DIAGNOSIS — E785 Hyperlipidemia, unspecified: Secondary | ICD-10-CM | POA: Diagnosis not present

## 2016-07-29 DIAGNOSIS — Z87891 Personal history of nicotine dependence: Secondary | ICD-10-CM | POA: Insufficient documentation

## 2016-07-29 DIAGNOSIS — M7989 Other specified soft tissue disorders: Secondary | ICD-10-CM | POA: Diagnosis not present

## 2017-04-27 ENCOUNTER — Ambulatory Visit (INDEPENDENT_AMBULATORY_CARE_PROVIDER_SITE_OTHER): Payer: Medicare Other | Admitting: Family Medicine

## 2017-04-27 ENCOUNTER — Encounter: Payer: Self-pay | Admitting: Family Medicine

## 2017-04-27 VITALS — BP 84/68 | HR 94 | Temp 98.8°F | Ht 66.0 in | Wt 100.4 lb

## 2017-04-27 DIAGNOSIS — J301 Allergic rhinitis due to pollen: Secondary | ICD-10-CM

## 2017-04-27 DIAGNOSIS — R5383 Other fatigue: Secondary | ICD-10-CM | POA: Diagnosis not present

## 2017-04-27 LAB — CBC
HEMATOCRIT: 35.8 % — AB (ref 36.0–46.0)
HEMOGLOBIN: 11.7 g/dL — AB (ref 12.0–15.0)
MCHC: 32.8 g/dL (ref 30.0–36.0)
MCV: 91 fl (ref 78.0–100.0)
Platelets: 297 10*3/uL (ref 150.0–400.0)
RBC: 3.94 Mil/uL (ref 3.87–5.11)
RDW: 13.7 % (ref 11.5–15.5)
WBC: 6.2 10*3/uL (ref 4.0–10.5)

## 2017-04-27 MED ORDER — FLUTICASONE PROPIONATE 50 MCG/ACT NA SUSP: 2.0000 | Freq: Every day | NASAL | 3 refills | Status: DC

## 2017-04-27 NOTE — Patient Instructions (Addendum)
Recheck blood pressure  Please stop by lab before you go  Allergic Rhinitis History and exam today are suggestive of allergies as the cause of your symptoms. Your lungs were clear. With your fatigue we will also check you for anemia as you have had very mild anemia in the past but could just be due to allergy.   Trial flonase for 1 month- update [place] in 1 month if not significantly better as may aldd a claritin or allegra at night  Finally, we reviewed reasons to return to care including if symptoms worsen or persist or new concerns arise- once again particularly shortness of breath or fever.   Meds ordered this encounter  Medications  . fluticasone (FLONASE) 50 MCG/ACT nasal spray    Sig: Place 2 sprays into both nostrils daily.    Dispense:  16 g    Refill:  3

## 2017-04-27 NOTE — Progress Notes (Signed)
PCP: Marin Olp, MD  Subjective:  Brandy Conway is a 76 y.o. year old very pleasant female patient who presents with allergic symptoms including cough, rhinorrhea (though mild), postnasal drip. Symptoms present for 2 months. Also feels fatigued. Symptoms are stable/not worsening.  -previous treatments: none -sick contacts/travel/risks: denies flu exposure.  -Hx of: allergies- denies but aware how bad pollen has been this season  ROS-denies fever, SOB, NVD, tooth pain  Pertinent Past Medical History-  Patient Active Problem List   Diagnosis Date Noted  . 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 CAP (community acquired pneumonia) 03/15/2015    Priority: Medium  . Hyperlipidemia 09/24/2014    Priority: Medium  . Trigeminal neuralgia 09/24/2014    Priority: Medium  . History of skin cancer     Priority: Low  . Claudication ([place]) 06/26/2015  . Lower extremity edema 06/26/2015  . Shortness of breath 04/10/2015   Medications- reviewed  Current Outpatient Prescriptions  Medication Sig Dispense Refill  . aspirin 325 MG tablet Take 325 mg by mouth daily.    . Multiple Vitamins-Minerals (ONE-A-DAY 50 PLUS PO) Take 1 tablet by mouth daily.     No current facility-administered medications for this visit.     Objective: BP (!) 84/68 (BP Location: Left Arm, Patient Position: Sitting, Cuff Size: Normal)   Pulse 94   Temp 98.8 F (37.1 C) (Oral)   Ht 5\' 6"  (1.676 m)   Wt 100 lb 6.4 oz (45.5 kg)   SpO2 96%   BMI 16.20 kg/m  Gen: NAD, resting comfortably HEENT: Turbinates erythematous, TM normal, pharynx mildly erythematous with no tonsilar exudate or edema, no sinus tenderness CV: RRR no murmurs rubs or gallops Lungs: CTAB no crackles, wheeze, rhonchi Abdomen: soft/nontender/nondistended/normal bowel sounds. No rebound or guarding.  Ext: no edema Skin: warm, dry, no rash Neuro: grossly normal, moves all extremities  Assessment/Plan:  Allergic  Rhinitis History and exam today are suggestive of allergies as the cause of your symptoms. Your lungs were clear. With your fatigue we will also check you for anemia as you have had very mild anemia in the past but could just be due to allergy.   Trial flonase for 1 month- update [place] in 1 month if not significantly better as may aldd a claritin or allegra at night  Finally, we reviewed reasons to return to care including if symptoms worsen or persist or new concerns arise- once again particularly shortness of breath or fever.   Meds ordered this encounter  Medications  . fluticasone (FLONASE) 50 MCG/ACT nasal spray    Sig: Place 2 sprays into both nostrils daily.    Dispense:  16 g    Refill:  3   [name], MD

## 2017-05-24 ENCOUNTER — Other Ambulatory Visit: Payer: Self-pay | Admitting: Family Medicine

## 2017-05-24 DIAGNOSIS — Z1231 Encounter for screening mammogram for malignant neoplasm of breast: Secondary | ICD-10-CM

## 2017-06-01 ENCOUNTER — Ambulatory Visit: Payer: Medicare Other

## 2017-06-21 NOTE — Progress Notes (Signed)
Subjective:   Brandy Conway is a 76 y.o. female who presents for Medicare Annual (Subsequent) preventive examination.  The Patient was informed that the wellness visit is to identify future health risk and educate and initiate measures that can reduce risk for increased disease through the lifespan.    Annual Wellness Assessment  Reports health as  Still has facial nerve pain Stops her from eating and engaging in life Hurts when she talks or touch her face at times   Preventive Screening -Counseling & Management  Medicare Annual Preventive Care Visit - Subsequent Last OV 6/26 acute  Colonoscopy noted to be due 03/2020 ;states she has aged out Mammogram  06/2016 apt is in Sept to repeat Will have skin cancer check  Dexa did not have one last year; 2021 S/p TAH   IMM Due tetanus; PSV 23 and Flu vaccine   Hearing: may have hearing checked  Needed to schedule eye exam last year  Mood; states when she starts hurting, she feels bad but will get out     Former smoker with 8 pack year hx  Widowed in March 2015;  Lost son at 27 yo had maglinantt brain tumor [place]; plays bingo and reads   VS reviewed;   Diet tries to eat throughout the day. Will eat soft foods but anything can stimulate the left facial pain.  Managing weight at present but BMI 16   BMI 16 last year and discussed eating soft foods and shakes; states swallowing triggers her facial pain    Exercise Walks some; rides a stationary bike  Dental - hx of dental work Does any teeth hurt now     Sleep patterns: sleeps well   Pain? Functional losses due to pain  Home safety; lives with niece / 47? Small apt [employer] Has seat in shower in one level home  Safety risk review for community, smoke detectors; firearms and sunscreen when in the sun   Hearing Screening Comments: Thought she needed a hearing aid  Vision Screening Comments: Vision checks  Need to have this done, may need cataract  surgery Goes to one on [address]; [employer]    Cardiac Risk Factors Addressed Hyperlipidemia -neg Diabetes neg   Advanced Directives - completed   Patient Care Team: Marin Olp, MD as PCP - General (Family Medicine) Assessed for additional providers  Immunization History  Administered Date(s) Administered  . Influenza Whole 08/08/2007, 07/16/2010  . Influenza,inj,Quad PF,36+ Mos 07/19/2013, 07/21/2016  . Influenza-Unspecified 08/11/2014, 08/12/2015  . Pneumococcal Conjugate-13 09/24/2014  . Pneumococcal-Unspecified 09/02/2006   Required Immunizations needed today  Screening test up to date or reviewed for plan of completion Health Maintenance Due  Topic Date Due  . INFLUENZA VACCINE  06/02/2017    Defer tetanus until "I need it"  Declines PPV 23 today Will take her flu vaccine later on         Objective:     Vitals: BP (!) 118/50   Pulse 96   Ht 5\' 5"  (1.651 m)   Wt 100 lb (45.4 kg)   SpO2 95%   BMI 16.64 kg/m   Body mass index is 16.64 kg/m.   Tobacco History  Smoking Status  . Former Smoker  . Packs/day: 0.50  . Years: 16.00  . Types: Cigarettes  . Quit date: 11/02/1978  Smokeless Tobacco  . Never Used     Counseling given: Yes   Past Medical History:  Diagnosis Date  .  Allergy   . Cancer ([place])   . Facial nerve sensory disorder   . History of skin cancer    Past Surgical History:  Procedure Laterality Date  . ABDOMINAL HYSTERECTOMY    . TONSILLECTOMY AND ADENOIDECTOMY     Family History  Problem Relation Age of Onset  . Heart disease Mother        CHF  . Heart disease Brother 75       stents  . Asthma Maternal Grandmother   . Hypertension Maternal Grandmother   . Hypertension Paternal Grandmother   . Cancer Paternal Grandfather        liver  . Cancer Son        brain    History  Sexual Activity  . Sexual activity: No    Outpatient Encounter Prescriptions as of 06/22/2017  Medication Sig  . aspirin  325 MG tablet Take 325 mg by mouth daily.  . fluticasone (FLONASE) 50 MCG/ACT nasal spray Place 2 sprays into both nostrils daily.  . Multiple Vitamins-Minerals (ONE-A-DAY 50 PLUS PO) Take 1 tablet by mouth daily.   No facility-administered encounter medications on file as of 06/22/2017.     Activities of Daily Living In your present state of health, do you have any difficulty performing the following activities: 06/22/2017  Hearing? N  Vision? N  Difficulty concentrating or making decisions? N  Walking or climbing stairs? N  Dressing or bathing? N  Doing errands, shopping? N  Preparing Food and eating ? N  Using the Toilet? N  In the past six months, have you accidently leaked urine? N  Do you have problems with loss of bowel control? N  Managing your Medications? N  Managing your Finances? N  Housekeeping or managing your Housekeeping? N  Some recent data might be hidden    Patient Care Team: Marin Olp, MD as PCP - General (Family Medicine)    Assessment:     Exercise Activities and Dietary recommendations Current Exercise Habits: Home exercise routine  Goals    . Gain weight          Gain weight; Try to eat often  Would also like to go to the mountains. Will take her niece     . patient          Wants her pain controled;        Fall Risk Fall Risk  06/22/2017 05/29/2016 12/27/2015 09/24/2014  Falls in the past year? No No No No   Depression Screen PHQ 2/9 Scores 06/22/2017 05/29/2016 12/27/2015 09/24/2014  PHQ - 2 Score 1 0 0 1     Cognitive Function MMSE - Mini Mental State Exam 05/29/2016  Not completed: (No Data)        Immunization History  Administered Date(s) Administered  . Influenza Whole 08/08/2007, 07/16/2010  . Influenza,inj,Quad PF,36+ Mos 07/19/2013, 07/21/2016  . Influenza-Unspecified 08/11/2014, 08/12/2015  . Pneumococcal Conjugate-13 09/24/2014  . Pneumococcal-Unspecified 09/02/2006   Screening Tests Health Maintenance    Topic Date Due  . INFLUENZA VACCINE  06/02/2017  . PNA vac Low Risk Adult (2 of 2 - PPSV23) 08/01/2017 (Originally 09/25/2015)  . TETANUS/TDAP  07/02/2018 (Originally 09/21/1960)  . DEXA SCAN  03/14/2020 (Originally 09/21/2006)  . COLONOSCOPY  03/14/2020 (Originally 09/22/1991)      Plan:     PCP Notes   Health Maintenance Declines dexa- States she would not take any medication other than calcium if her DEXA was abnormal.   Mammogram scheduled for later this  year  The patient had a unspecified pneumonia apparently couple weeks before she turns 23 . Stated she will discuss with Dr. Yong Channel whether she would take another pneumonia as she had pneumonia after taking one of the injections. Reassured her that the pneumonia injections are inactivated.  The patient stated she may have a hearing check at some point. The patient states she will schedule a vision exam soon and she feels like her cataracts are getting worse.   also discussed her tetanus and she declines for today.   Abnormal Screens  BMI 16 the patient tries to eat throughout the day. States her left-sided facial pain can be stimulated by any action of them jaw all. Weight is holding at 100 and states she has been this weight for the last year. Does agree to see Dr. Yong Channel for follow-up this fall   Referrals   no referrals today  Patient concerns; The patient continues to be frustrated with her facial pain. Otherwise she is remains healthy and engaging. States that when she does get down she gets in the car and go shopping or reads or does something to lift her spirits.   She was given the telephone number for the support group associated with the facial pain nerve association. She has a niece that can search on the internet for more information .   Nurse Concerns; The patient again is trying to eat protein. Always a concern of loss of muscle mass decreased immune system etc. will follow-up with Dr. Yong Channel   Next PCP  apt Will make an apt to fup on labs or other Weight 100 and holding; still has difficulty eating   Will also fup regarding whether she wants to take the psv 23 Will take her flu vaccine when she comes back       I have personally reviewed and noted the following in the patient's chart:   . Medical and social history . Use of alcohol, tobacco or illicit drugs  . Current medications and supplements . Functional ability and status . Nutritional status . Physical activity . Advanced directives . List of other physicians . Hospitalizations, surgeries, and ER visits in previous 12 months . Vitals . Screenings to include cognitive, depression, and falls . Referrals and appointments  In addition, I have reviewed and discussed with patient certain preventive protocols, quality metrics, and best practice recommendations. A written personalized care plan for preventive services as well as general preventive health recommendations were provided to patient.     Wynetta Fines, RN  06/22/2017

## 2017-06-22 ENCOUNTER — Ambulatory Visit (INDEPENDENT_AMBULATORY_CARE_PROVIDER_SITE_OTHER): Payer: Medicare Other

## 2017-06-22 VITALS — BP 118/50 | HR 96 | Ht 65.0 in | Wt 100.0 lb

## 2017-06-22 DIAGNOSIS — Z0000 Encounter for general adult medical examination without abnormal findings: Secondary | ICD-10-CM | POA: Diagnosis not present

## 2017-06-22 NOTE — Patient Instructions (Addendum)
Brandy Conway , Thank you for taking time to come for your Medicare Wellness Visit. I appreciate your ongoing commitment to your health goals. Please review the following plan we discussed and let me know if I can assist you in the future.   Will make a fup apt with Dr. Yong Channel  A Tetanus is recommended every 10 years. Medicare covers a tetanus if you have a cut or wound; otherwise, there may be a charge. If you had not had a tetanus with pertusses, known as the Tdap, you can take this anytime.   Will take her flu vaccine this fall Keep in mind the flu shot is an inactivated vaccine and takes at least 2 weeks to build immunity. The flu virus can be dormant for 4 days prior to symptoms Taking the flu shot at the beginning of the season can reduce the risk for the entire community.   The Centers for Disease Control are now recommending 2 pneumonia vaccinations after 12. The first is the Prevnar 13. This helps to boost your immunity to community acquired pneumonia as well as some protection from bacterial pneumonia  The 2nd is the pneumovax 23, which offers more broad protection!  Please consider taking these as this is your best protection against pneumonia.  You had the Pneumovax 23 prior to your 65th birthday as it is recorded Then prevnar 13 in 2015;  The guidelines state you can take another PSV23; it is up to you.   Deaf & Hard of Hearing Division Services - can assist with hearing aid x 1  No reviews  [employer] Office  [address]  [phone]  Will make an eye apt to fup on cataracts and vision   These are the goals we discussed: Goals    . Gain weight          Gain weight; Try to eat often  Would also like to go to the mountains. Will take her niece     . patient          Wants her pain controled;         This is a list of the screening recommended for you and due dates:  Health Maintenance  Topic Date Due  . Flu Shot  06/02/2017  . Pneumonia  vaccines (2 of 2 - PPSV23) 08/01/2017*  . Tetanus Vaccine  07/02/2018*  . DEXA scan (bone density measurement)  03/14/2020*  . Colon Cancer Screening  03/14/2020*  *Topic was postponed. The date shown is not the original due date.    Contact us Facial pain association;  [email or web address]://fpa-[email or web address]/support-[email or web address] We have a dedicated call center that accepts phone calls from Monday through Friday from 9am-5pm EST. If you need assistance or support, please call [phone], [phone], or email [place] at [email or web address] to speak to the [facility] Coordinator of Patient Services. If you don't reach [place], please leave a detailed message so that we can get back to you as soon as possible. Find more ways to reach [place] here.   Fall Prevention in the Home Falls can cause injuries and can affect people from all age groups. There are many simple things that you can do to make your home safe and to help prevent falls. What can I do on the outside of my home?  Regularly repair the edges of walkways and driveways and fix any cracks.  Remove high doorway thresholds.  Trim any shrubbery on the main path into your home.  Use bright outdoor  lighting.  Clear walkways of debris and clutter, including tools and rocks.  Regularly check that handrails are securely fastened and in good repair. Both sides of any steps should have handrails.  Install guardrails along the edges of any raised decks or porches.  Have leaves, snow, and ice cleared regularly.  Use sand or salt on walkways during winter months.  In the garage, clean up any spills right away, including grease or oil spills. What can I do in the bathroom?  Use night lights.  Install grab bars by the toilet and in the tub and shower. Do not use towel bars as grab bars.  Use non-skid mats or decals on the floor of the tub or shower.  If you need to sit down while you are in the shower, use a plastic, non-slip stool.  Keep the floor dry. Immediately  clean up any water that spills on the floor.  Remove soap buildup in the tub or shower on a regular basis.  Attach bath mats securely with double-sided non-slip rug tape.  Remove throw rugs and other tripping hazards from the floor. What can I do in the bedroom?  Use night lights.  Make sure that a bedside light is easy to reach.  Do not use oversized bedding that drapes onto the floor.  Have a firm chair that has side arms to use for getting dressed.  Remove throw rugs and other tripping hazards from the floor. What can I do in the kitchen?  Clean up any spills right away.  Avoid walking on wet floors.  Place frequently used items in easy-to-reach places.  If you need to reach for something above you, use a sturdy step stool that has a grab bar.  Keep electrical cables out of the way.  Do not use floor polish or wax that makes floors slippery. If you have to use wax, make sure that it is non-skid floor wax.  Remove throw rugs and other tripping hazards from the floor. What can I do in the stairways?  Do not leave any items on the stairs.  Make sure that there are handrails on both sides of the stairs. Fix handrails that are broken or loose. Make sure that handrails are as long as the stairways.  Check any carpeting to make sure that it is firmly attached to the stairs. Fix any carpet that is loose or worn.  Avoid having throw rugs at the top or bottom of stairways, or secure the rugs with carpet tape to prevent them from moving.  Make sure that you have a light switch at the top of the stairs and the bottom of the stairs. If you do not have them, have them installed. What are some other fall prevention tips?  Wear closed-toe shoes that fit well and support your feet. Wear shoes that have rubber soles or low heels.  When you use a stepladder, make sure that it is completely opened and that the sides are firmly locked. Have someone hold the ladder while you are using  it. Do not climb a closed stepladder.  Add color or contrast paint or tape to grab bars and handrails in your home. Place contrasting color strips on the first and last steps.  Use mobility aids as needed, such as canes, walkers, scooters, and crutches.  Turn on lights if it is dark. Replace any light bulbs that burn out.  Set up furniture so that there are clear paths. Keep the furniture in the same spot.  Fix any uneven floor surfaces.  Choose a carpet design that does not hide the edge of steps of a stairway.  Be aware of any and all pets.  Review your medicines with your healthcare provider. Some medicines can cause dizziness or changes in blood pressure, which increase your risk of falling. Talk with your health care provider about other ways that you can decrease your risk of falls. This may include working with a physical therapist or trainer to improve your strength, balance, and endurance. This information is not intended to replace advice given to you by your health care provider. Make sure you discuss any questions you have with your health care provider. Document Released: 10/09/2002 Document Revised: 03/17/2016 Document Reviewed: 11/23/2014 Elsevier Interactive Patient Education  2017 [employer] Maintenance for Postmenopausal Women Menopause is a normal process in which your reproductive ability comes to an end. This process happens gradually over a span of months to years, usually between the ages of 89 and 34. Menopause is complete when you have missed 12 consecutive menstrual periods. It is important to talk with your health care provider about some of the most common conditions that affect postmenopausal women, such as heart disease, cancer, and bone loss (osteoporosis). Adopting a healthy lifestyle and getting preventive care can help to promote your health and wellness. Those actions can also lower your chances of developing some of these common  conditions. What should I know about menopause? During menopause, you may experience a number of symptoms, such as:  Moderate-to-severe hot flashes.  Night sweats.  Decrease in sex drive.  Mood swings.  Headaches.  Tiredness.  Irritability.  Memory problems.  Insomnia.  Choosing to treat or not to treat menopausal changes is an individual decision that you make with your health care provider. What should I know about hormone replacement therapy and supplements? Hormone therapy products are effective for treating symptoms that are associated with menopause, such as hot flashes and night sweats. Hormone replacement carries certain risks, especially as you become older. If you are thinking about using estrogen or estrogen with progestin treatments, discuss the benefits and risks with your health care provider. What should I know about heart disease and stroke? Heart disease, heart attack, and stroke become more likely as you age. This may be due, in part, to the hormonal changes that your body experiences during menopause. These can affect how your body processes dietary fats, triglycerides, and cholesterol. Heart attack and stroke are both medical emergencies. There are many things that you can do to help prevent heart disease and stroke:  Have your blood pressure checked at least every 1-2 years. High blood pressure causes heart disease and increases the risk of stroke.  If you are 43-40 years old, ask your health care provider if you should take aspirin to prevent a heart attack or a stroke.  Do not use any tobacco products, including cigarettes, chewing tobacco, or electronic cigarettes. If you need help quitting, ask your health care provider.  It is important to eat a healthy diet and maintain a healthy weight. ? Be sure to include plenty of vegetables, fruits, low-fat dairy products, and lean protein. ? Avoid eating foods that are high in solid fats, added sugars, or salt  (sodium).  Get regular exercise. This is one of the most important things that you can do for your health. ? Try to exercise for at least 150 minutes each week. The type of exercise that you do should increase  your heart rate and make you sweat. This is known as moderate-intensity exercise. ? Try to do strengthening exercises at least twice each week. Do these in addition to the moderate-intensity exercise.  Know your numbers.Ask your health care provider to check your cholesterol and your blood glucose. Continue to have your blood tested as directed by your health care provider.  What should I know about cancer screening? There are several types of cancer. Take the following steps to reduce your risk and to catch any cancer development as early as possible. Breast Cancer  Practice breast self-awareness. ? This means understanding how your breasts normally appear and feel. ? It also means doing regular breast self-exams. Let your health care provider know about any changes, no matter how small.  If you are 60 or older, have a clinician do a breast exam (clinical breast exam or CBE) every year. Depending on your age, family history, and medical history, it may be recommended that you also have a yearly breast X-ray (mammogram).  If you have a family history of breast cancer, talk with your health care provider about genetic screening.  If you are at high risk for breast cancer, talk with your health care provider about having an MRI and a mammogram every year.  Breast cancer (BRCA) gene test is recommended for women who have family members with BRCA-related cancers. Results of the assessment will determine the need for genetic counseling and BRCA1 and for BRCA2 testing. BRCA-related cancers include these types: ? Breast. This occurs in males or females. ? Ovarian. ? Tubal. This may also be called fallopian tube cancer. ? Cancer of the abdominal or pelvic lining (peritoneal  cancer). ? Prostate. ? Pancreatic.  Cervical, Uterine, and Ovarian Cancer Your health care provider may recommend that you be screened regularly for cancer of the pelvic organs. These include your ovaries, uterus, and vagina. This screening involves a pelvic exam, which includes checking for microscopic changes to the surface of your cervix (Pap test).  For women ages 21-65, health care providers may recommend a pelvic exam and a Pap test every three years. For women ages 72-65, they may recommend the Pap test and pelvic exam, combined with testing for human papilloma virus (HPV), every five years. Some types of HPV increase your risk of cervical cancer. Testing for HPV may also be done on women of any age who have unclear Pap test results.  Other health care providers may not recommend any screening for nonpregnant women who are considered low risk for pelvic cancer and have no symptoms. Ask your health care provider if a screening pelvic exam is right for you.  If you have had past treatment for cervical cancer or a condition that could lead to cancer, you need Pap tests and screening for cancer for at least 20 years after your treatment. If Pap tests have been discontinued for you, your risk factors (such as having a new sexual partner) need to be reassessed to determine if you should start having screenings again. Some women have medical problems that increase the chance of getting cervical cancer. In these cases, your health care provider may recommend that you have screening and Pap tests more often.  If you have a family history of uterine cancer or ovarian cancer, talk with your health care provider about genetic screening.  If you have vaginal bleeding after reaching menopause, tell your health care provider.  There are currently no reliable tests available to screen for ovarian cancer.  Lung  Cancer Lung cancer screening is recommended for adults 61-70 years old who are at high risk for  lung cancer because of a history of smoking. A yearly low-dose CT scan of the lungs is recommended if you:  Currently smoke.  Have a history of at least 30 pack-years of smoking and you currently smoke or have quit within the past 15 years. A pack-year is smoking an average of one pack of cigarettes per day for one year.  Yearly screening should:  Continue until it has been 15 years since you quit.  Stop if you develop a health problem that would prevent you from having lung cancer treatment.  Colorectal Cancer  This type of cancer can be detected and can often be prevented.  Routine colorectal cancer screening usually begins at age 88 and continues through age 34.  If you have risk factors for colon cancer, your health care provider may recommend that you be screened at an earlier age.  If you have a family history of colorectal cancer, talk with your health care provider about genetic screening.  Your health care provider may also recommend using home test kits to check for hidden blood in your stool.  A small camera at the end of a tube can be used to examine your colon directly (sigmoidoscopy or colonoscopy). This is done to check for the earliest forms of colorectal cancer.  Direct examination of the colon should be repeated every 5-10 years until age 70. However, if early forms of precancerous polyps or small growths are found or if you have a family history or genetic risk for colorectal cancer, you may need to be screened more often.  Skin Cancer  Check your skin from head to toe regularly.  Monitor any moles. Be sure to tell your health care provider: ? About any new moles or changes in moles, especially if there is a change in a mole's shape or color. ? If you have a mole that is larger than the size of a pencil eraser.  If any of your family members has a history of skin cancer, especially at a young age, talk with your health care provider about genetic  screening.  Always use sunscreen. Apply sunscreen liberally and repeatedly throughout the day.  Whenever you are outside, protect yourself by wearing long sleeves, pants, a wide-brimmed hat, and sunglasses.  What should I know about osteoporosis? Osteoporosis is a condition in which bone destruction happens more quickly than new bone creation. After menopause, you may be at an increased risk for osteoporosis. To help prevent osteoporosis or the bone fractures that can happen because of osteoporosis, the following is recommended:  If you are 46-59 years old, get at least 1,000 mg of calcium and at least 600 mg of vitamin D per day.  If you are older than age 74 but younger than age 61, get at least 1,200 mg of calcium and at least 600 mg of vitamin D per day.  If you are older than age 46, get at least 1,200 mg of calcium and at least 800 mg of vitamin D per day.  Smoking and excessive alcohol intake increase the risk of osteoporosis. Eat foods that are rich in calcium and vitamin D, and do weight-bearing exercises several times each week as directed by your health care provider. What should I know about how menopause affects my mental health? Depression may occur at any age, but it is more common as you become older. Common symptoms of depression  include:  Low or sad mood.  Changes in sleep patterns.  Changes in appetite or eating patterns.  Feeling an overall lack of motivation or enjoyment of activities that you previously enjoyed.  Frequent crying spells.  Talk with your health care provider if you think that you are experiencing depression. What should I know about immunizations? It is important that you get and maintain your immunizations. These include:  Tetanus, diphtheria, and pertussis (Tdap) booster vaccine.  Influenza every year before the flu season begins.  Pneumonia vaccine.  Shingles vaccine.  Your health care provider may also recommend other  immunizations. This information is not intended to replace advice given to you by your health care provider. Make sure you discuss any questions you have with your health care provider. Document Released: 12/11/2005 Document Revised: 05/08/2016 Document Reviewed: 07/23/2015 Elsevier Interactive Patient Education  2018 [employer].

## 2017-06-23 NOTE — Progress Notes (Signed)
I have reviewed and agree with note, evaluation, plan. Hopeful can find a solution to help patient with calories/weight gain at next visit.   Of note- I was on vacation day at time of this visit but was in office from 8:45 - 10:15 Am for administrative duties.   [name], MD

## 2017-07-13 ENCOUNTER — Ambulatory Visit
Admission: RE | Admit: 2017-07-13 | Discharge: 2017-07-13 | Disposition: A | Discharge status: Home / Self Care | Payer: Medicare Other | Source: Ambulatory Visit | Attending: Family Medicine | Admitting: Family Medicine

## 2017-07-13 DIAGNOSIS — L820 Inflamed seborrheic keratosis: Secondary | ICD-10-CM | POA: Diagnosis not present

## 2017-07-13 DIAGNOSIS — D225 Melanocytic nevi of trunk: Secondary | ICD-10-CM | POA: Diagnosis not present

## 2017-07-13 DIAGNOSIS — Z85828 Personal history of other malignant neoplasm of skin: Secondary | ICD-10-CM | POA: Diagnosis not present

## 2017-07-13 DIAGNOSIS — L821 Other seborrheic keratosis: Secondary | ICD-10-CM | POA: Diagnosis not present

## 2017-07-13 DIAGNOSIS — D1801 Hemangioma of skin and subcutaneous tissue: Secondary | ICD-10-CM | POA: Diagnosis not present

## 2017-07-13 DIAGNOSIS — L570 Actinic keratosis: Secondary | ICD-10-CM | POA: Diagnosis not present

## 2017-07-13 DIAGNOSIS — Z1231 Encounter for screening mammogram for malignant neoplasm of breast: Secondary | ICD-10-CM | POA: Diagnosis not present

## 2017-07-13 DIAGNOSIS — L814 Other melanin hyperpigmentation: Secondary | ICD-10-CM | POA: Diagnosis not present

## 2017-07-13 DIAGNOSIS — L720 Epidermal cyst: Secondary | ICD-10-CM | POA: Diagnosis not present

## 2017-07-20 ENCOUNTER — Ambulatory Visit: Payer: Medicare Other | Admitting: Family Medicine

## 2017-08-17 ENCOUNTER — Ambulatory Visit (INDEPENDENT_AMBULATORY_CARE_PROVIDER_SITE_OTHER): Payer: Medicare Other | Admitting: Family Medicine

## 2017-08-17 ENCOUNTER — Encounter: Payer: Self-pay | Admitting: Family Medicine

## 2017-08-17 VITALS — BP 130/70 | HR 98 | Temp 97.3°F | Ht 65.0 in | Wt 101.6 lb

## 2017-08-17 DIAGNOSIS — E46 Unspecified protein-calorie malnutrition: Secondary | ICD-10-CM

## 2017-08-17 DIAGNOSIS — G500 Trigeminal neuralgia: Secondary | ICD-10-CM | POA: Diagnosis not present

## 2017-08-17 DIAGNOSIS — E440 Moderate protein-calorie malnutrition: Secondary | ICD-10-CM | POA: Diagnosis not present

## 2017-08-17 DIAGNOSIS — Z23 Encounter for immunization: Secondary | ICD-10-CM | POA: Diagnosis not present

## 2017-08-17 DIAGNOSIS — E785 Hyperlipidemia, unspecified: Secondary | ICD-10-CM | POA: Diagnosis not present

## 2017-08-17 DIAGNOSIS — D649 Anemia, unspecified: Secondary | ICD-10-CM

## 2017-08-17 DIAGNOSIS — I739 Peripheral vascular disease, unspecified: Secondary | ICD-10-CM | POA: Diagnosis not present

## 2017-08-17 HISTORY — DX: Unspecified protein-calorie malnutrition: E46

## 2017-08-17 HISTORY — DX: Anemia, unspecified: D64.9

## 2017-08-17 LAB — CBC WITH DIFFERENTIAL/PLATELET
BASOS PCT: 0.9 % (ref 0.0–3.0)
Basophils Absolute: 0 10*3/uL (ref 0.0–0.1)
EOS ABS: 0 10*3/uL (ref 0.0–0.7)
EOS PCT: 0.4 % (ref 0.0–5.0)
HCT: 36.6 % (ref 36.0–46.0)
HEMOGLOBIN: 11.7 g/dL — AB (ref 12.0–15.0)
LYMPHS ABS: 1.1 10*3/uL (ref 0.7–4.0)
Lymphocytes Relative: 22.5 % (ref 12.0–46.0)
MCHC: 32.1 g/dL (ref 30.0–36.0)
MCV: 92 fl (ref 78.0–100.0)
MONO ABS: 0.4 10*3/uL (ref 0.1–1.0)
Monocytes Relative: 7.5 % (ref 3.0–12.0)
NEUTROS ABS: 3.4 10*3/uL (ref 1.4–7.7)
NEUTROS PCT: 68.7 % (ref 43.0–77.0)
PLATELETS: 298 10*3/uL (ref 150.0–400.0)
RBC: 3.98 Mil/uL (ref 3.87–5.11)
RDW: 13.8 % (ref 11.5–15.5)
WBC: 4.9 10*3/uL (ref 4.0–10.5)

## 2017-08-17 LAB — COMPREHENSIVE METABOLIC PANEL
ALT: 16 U/L (ref 0–35)
AST: 20 U/L (ref 0–37)
Albumin: 3.8 g/dL (ref 3.5–5.2)
Alkaline Phosphatase: 51 U/L (ref 39–117)
BUN: 27 mg/dL — AB (ref 6–23)
CO2: 30 meq/L (ref 19–32)
CREATININE: 0.46 mg/dL (ref 0.40–1.20)
Calcium: 9.5 mg/dL (ref 8.4–10.5)
Chloride: 103 mEq/L (ref 96–112)
GFR: 140.41 mL/min (ref >60.00)
GLUCOSE: 88 mg/dL (ref 70–99)
POTASSIUM: 4.4 meq/L (ref 3.5–5.1)
Sodium: 140 mEq/L (ref 135–145)
Total Bilirubin: 0.5 mg/dL (ref 0.2–1.2)
Total Protein: 6.6 g/dL (ref 6.0–8.3)

## 2017-08-17 LAB — LDL CHOLESTEROL, DIRECT: LDL DIRECT: 96 mg/dL

## 2017-08-17 NOTE — Patient Instructions (Addendum)
I would love for you to have an ensure once a day at least but you declines. I would like for you to try to eat larger portions at a minimum- thrilled weight up 1 lb  Please stop by lab before you go  Due to your anemia would you consider doing the cologuard test? This is a coon cancer screening test

## 2017-08-17 NOTE — Assessment & Plan Note (Signed)
S: mild anemia in past and noted again today A/P: strongly encouraged cologuard- she seems pre contemplative. Firmly declines colonoscopy

## 2017-08-17 NOTE — Assessment & Plan Note (Signed)
S: has not tolerated various trials of medication and has declined to try more. Intermittent symptoms at present but can be severe A/P: continue without medication

## 2017-08-17 NOTE — Assessment & Plan Note (Addendum)
S: muscle wasting in face, prominent ribs and spine. BMI under 17 A/P: encouraged regular use of ensure daily- declines. Weight is up 1 lbs. Does state she will try to increase portion sizes. Protein very important here as well- not just calories- reinforce next visit

## 2017-08-17 NOTE — Progress Notes (Signed)
Subjective:  Brandy Conway is a 76 y.o. year old very pleasant female patient who presents for/with See problem oriented charting ROS- admits to pain in feet and legs when walking- better with rest, no edema with compression stockings. No fever or chills.    Past Medical History-  Patient Active Problem List   Diagnosis Date Noted  . 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 Protein-calorie malnutrition ([place]) 08/17/2017    Priority: Medium  . CAP (community acquired pneumonia) 03/15/2015    Priority: Medium  . Hyperlipidemia 09/24/2014    Priority: Medium  . Trigeminal neuralgia 09/24/2014    Priority: Medium  . Claudication ([place]) 06/26/2015    Priority: Low  . Lower extremity edema 06/26/2015    Priority: Low  . Shortness of breath 04/10/2015    Priority: Low  . History of skin cancer     Priority: Low  . Mild anemia 08/17/2017   Medications- reviewed and updated Current Outpatient Prescriptions  Medication Sig Dispense Refill  . aspirin 325 MG tablet Take 325 mg by mouth daily.    . Multiple Vitamins-Minerals (ONE-A-DAY 50 PLUS PO) Take 1 tablet by mouth daily.     No current facility-administered medications for this visit.    Objective: BP 130/70 (BP Location: Left Arm, Patient Position: Sitting, Cuff Size: Normal)   Pulse 98   Temp (!) 97.3 F (36.3 C) (Oral)   Ht 5\' 5"  (1.651 m)   Wt 101 lb 9.6 oz (46.1 kg)   SpO2 98%   BMI 16.91 kg/m  Gen: NAD, resting comfortably CV: RRR no murmurs rubs or gallops Easy to feel ribs and spine due to low weight Lungs: CTAB no crackles, wheeze, rhonchi  Ext: no edema under compression stockings Skin: warm, dry, wasting noted in face and temples  Assessment/Plan:  Declines pneumovax 23 repeat- had last at age 31. Felt very ill after this and firmly declines repeat.   Hyperlipidemia S: well controlled today with LDL down to 96- last check in 2015 was 110. No myalgias.   A/P: pleased with LDL  improvement but not weight loss over time and protein calorie malnutrition (see that section)- would actually trade slight bump in lipids to get her to healthier weight.   Claudication [facility]) S:Trouble walking for extended period- starts to hurt in her feet and lower legs. Better with rest. Issue for several years now. Normal arterial study 06/2015 A/P: will continue to monitor but I suspect this is more arthritis in her feet than it is true CV claudication  Trigeminal neuralgia S: has not tolerated various trials of medication and has declined to try more. Intermittent symptoms at present but can be severe A/P: continue without medication   Protein-calorie malnutrition ([place]) S: muscle wasting in face, prominent ribs and spine. BMI under 17 A/P: encouraged regular use of ensure daily- declines. Weight is up 1 lbs. Does state she will try to increase portion sizes. Protein very important here as well- not just calories- reinforce next visit  Mild anemia S: mild anemia in past and noted again today A/P: strongly encouraged cologuard- she seems pre contemplative. Firmly declines colonoscopy  did not make planned follow up. Hopefully will repeat [facility] within a year and follow up with [place] before or after that- she [place] out of the city and transport is not easy for her  Orders Placed This Encounter  Procedures  . Flu Vaccine QUAD 36+ mos IM  . CBC with Differential/Platelet  .  Comprehensive metabolic panel    [place]  . LDL cholesterol, direct    [place]   Return precautions advised.  [name], MD

## 2017-08-17 NOTE — Assessment & Plan Note (Signed)
S:Trouble walking for extended period- starts to hurt in her feet and lower legs. Better with rest. Issue for several years now. Normal arterial study 06/2015 A/P: will continue to monitor but I suspect this is more arthritis in her feet than it is true CV claudication

## 2017-08-17 NOTE — Assessment & Plan Note (Signed)
S: well controlled today with LDL down to 96- last check in 2015 was 110. No myalgias.   A/P: pleased with LDL improvement but not weight loss over time and protein calorie malnutrition (see that section)- would actually trade slight bump in lipids to get her to healthier weight.

## 2017-11-10 ENCOUNTER — Telehealth: Payer: Self-pay | Admitting: Family Medicine

## 2017-11-10 NOTE — Telephone Encounter (Signed)
[employer] sent fax that patient has not completed cologuard(order number [id number],QKSKS date 09/02/17). They have attempted to contact patient several times.

## 2017-11-12 NOTE — Telephone Encounter (Signed)
Spoke with patient who states she still has her kit but her facial pain has been so bad that she hasn't been able to think about doing her kit. I did schedule her for an appointment next week

## 2017-11-18 ENCOUNTER — Encounter: Payer: Self-pay | Admitting: Family Medicine

## 2017-11-18 ENCOUNTER — Ambulatory Visit (INDEPENDENT_AMBULATORY_CARE_PROVIDER_SITE_OTHER): Payer: Medicare Other | Admitting: Family Medicine

## 2017-11-18 VITALS — BP 126/54 | Temp 98.0°F | Ht 65.0 in | Wt 101.2 lb

## 2017-11-18 DIAGNOSIS — G500 Trigeminal neuralgia: Secondary | ICD-10-CM | POA: Diagnosis not present

## 2017-11-18 MED ORDER — BACLOFEN 5 MG PO TABS: 5.0000 mg | ORAL_TABLET | Freq: Three times a day (TID) | ORAL | 2 refills | Status: DC

## 2017-11-18 NOTE — Patient Instructions (Addendum)
Lets try baclofen 5mg  three times a day. If it makes you too drowsy can try just twice a day or you can try half a tablet two to three times a day.   If you cannot tolerate it- lets go back and try the carbamazepine one more time. Just give [place] a call

## 2017-11-18 NOTE — Assessment & Plan Note (Signed)
S: Patient with continued issues with trigeminal neuralgia since at least 2005. She went to ED with severe episode 2/2-17 and CT negative for acute cause or mass. She has seen Dr. Tomi Likens in the past 05/2015 and started on oxcarbazepine - she states she felt very weak with this. We later tried alternate and jad generalized weakness with carbamazepine.    Current episode of worsening pain at least 4-5 days- still obviously left sided as has always been. Triggers touching it, talking, swallowing. Has had severe pain and could not sleep the last few nights.  A/P: Patient even willing to retrial carbamazepine though felt very weak on it. We opted after discussion to trial baclofen 5mg  TID first - could even try to cut in half or take BID if SE strong. If this doesn't work she is willing to try prior carbamazepine- she will call us and im willing to change by phone

## 2017-11-18 NOTE — Progress Notes (Signed)
Subjective:  Brandy Conway is a 77 y.o. year old very pleasant female patient who presents for/with See problem oriented charting ROS- No facial or extremity weakness. No slurred words or trouble swallowing- though speech is harder when pain has flared up. no blurry vision or double vision. No confusion or word finding difficulties.    Past Medical History-  Patient Active Problem List   Diagnosis Date Noted  . 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 Protein-calorie malnutrition ([place]) 08/17/2017    Priority: Medium  . CAP (community acquired pneumonia) 03/15/2015    Priority: Medium  . Hyperlipidemia 09/24/2014    Priority: Medium  . Trigeminal neuralgia 09/24/2014    Priority: Medium  . Claudication ([place]) 06/26/2015    Priority: Low  . Lower extremity edema 06/26/2015    Priority: Low  . Shortness of breath 04/10/2015    Priority: Low  . History of skin cancer     Priority: Low  . Mild anemia 08/17/2017    Medications- reviewed and updated Current Outpatient Medications  Medication Sig Dispense Refill  . aspirin 325 MG tablet Take 325 mg by mouth daily.    . Multiple Vitamins-Minerals (ONE-A-DAY 50 PLUS PO) Take 1 tablet by mouth daily.     Objective: BP (!) 126/54 (BP Location: Right Arm, Patient Position: Sitting, Cuff Size: Normal)   Temp 98 F (36.7 C) (Oral)   Ht 5\' 5"  (1.651 m)   Wt 101 lb 3.2 oz (45.9 kg)   BMI 16.84 kg/m  Gen: NAD, intermittently appears in discomfort and reaches for left side of face CV: RRR no murmurs rubs or gallops Lungs: CTAB no crackles, wheeze, rhonchi Abdomen: very thin Ext: no edema Skin: warm, dry Neuro: normal speech, tries to limit facial movements on left due to pain.   Assessment/Plan:  Trigeminal neuralgia S: Patient with continued issues with trigeminal neuralgia since at least 2005. She went to ED with severe episode 2/2-17 and CT negative for acute cause or mass. She has seen Dr. Tomi Likens in the  past 05/2015 and started on oxcarbazepine - she states she felt very weak with this. We later tried alternate and jad generalized weakness with carbamazepine.    Current episode of worsening pain at least 4-5 days- still obviously left sided as has always been. Triggers touching it, talking, swallowing. Has had severe pain and could not sleep the last few nights.  A/P: Patient even willing to retrial carbamazepine though felt very weak on it. We opted after discussion to trial baclofen 5mg  TID first - could even try to cut in half or take BID if SE strong. If this doesn't work she is willing to try prior carbamazepine- she will call us and im willing to change by phone  Future Appointments  Date Time Provider [place]  12/21/2017 10:00 AM Marin Olp, MD [facility]  follow up (though I could be out of office at that time)  Meds ordered this encounter  Medications  . Baclofen 5 MG TABS    Sig: Take 5 mg by mouth 3 (three) times daily.    Dispense:  90 tablet    Refill:  2   Return precautions advised.  [name], MD

## 2017-12-02 ENCOUNTER — Telehealth: Payer: Self-pay

## 2017-12-02 NOTE — Telephone Encounter (Signed)
Review for outreach to schedule AWV Upon review, her last AWV was 06/2017;  Did not fup at this time

## 2017-12-21 ENCOUNTER — Ambulatory Visit (INDEPENDENT_AMBULATORY_CARE_PROVIDER_SITE_OTHER): Payer: Medicare Other | Admitting: Family Medicine

## 2017-12-21 ENCOUNTER — Encounter: Payer: Self-pay | Admitting: Family Medicine

## 2017-12-21 VITALS — BP 130/70 | HR 93 | Temp 97.8°F | Ht 65.0 in | Wt 101.6 lb

## 2017-12-21 DIAGNOSIS — E785 Hyperlipidemia, unspecified: Secondary | ICD-10-CM | POA: Diagnosis not present

## 2017-12-21 DIAGNOSIS — G500 Trigeminal neuralgia: Secondary | ICD-10-CM

## 2017-12-21 DIAGNOSIS — D649 Anemia, unspecified: Secondary | ICD-10-CM

## 2017-12-21 DIAGNOSIS — E440 Moderate protein-calorie malnutrition: Secondary | ICD-10-CM | POA: Diagnosis not present

## 2017-12-21 LAB — COMPREHENSIVE METABOLIC PANEL
ALBUMIN: 3.6 g/dL (ref 3.5–5.2)
ALT: 19 U/L (ref 0–35)
AST: 29 U/L (ref 0–37)
Alkaline Phosphatase: 60 U/L (ref 39–117)
BUN: 14 mg/dL (ref 6–23)
CALCIUM: 9.2 mg/dL (ref 8.4–10.5)
CHLORIDE: 100 meq/L (ref 96–112)
CO2: 32 mEq/L (ref 19–32)
Creatinine, Ser: 0.46 mg/dL (ref 0.40–1.20)
GFR: 140.28 mL/min (ref >60.00)
Glucose, Bld: 97 mg/dL (ref 70–99)
POTASSIUM: 3.7 meq/L (ref 3.5–5.1)
Sodium: 138 mEq/L (ref 135–145)
Total Bilirubin: 0.5 mg/dL (ref 0.2–1.2)
Total Protein: 6.5 g/dL (ref 6.0–8.3)

## 2017-12-21 LAB — CBC
HCT: 36.3 % (ref 36.0–46.0)
HEMOGLOBIN: 12 g/dL (ref 12.0–15.0)
MCHC: 33.2 g/dL (ref 30.0–36.0)
MCV: 92.2 fl (ref 78.0–100.0)
PLATELETS: 243 10*3/uL (ref 150.0–400.0)
RBC: 3.94 Mil/uL (ref 3.87–5.11)
RDW: 13.6 % (ref 11.5–15.5)
WBC: 7.5 10*3/uL (ref 4.0–10.5)

## 2017-12-21 LAB — LDL CHOLESTEROL, DIRECT: Direct LDL: 82 mg/dL

## 2017-12-21 MED ORDER — GABAPENTIN 100 MG PO CAPS: 100.0000 mg | ORAL_CAPSULE | Freq: Two times a day (BID) | ORAL | 3 refills | Status: DC

## 2017-12-21 NOTE — Assessment & Plan Note (Signed)
Update LDL today- hopefully under 100. Wouldn't feel strongly about starting statin unless signfificant LDL elevation

## 2017-12-21 NOTE — Patient Instructions (Addendum)
Start gabapentin 100mg  at night for 2 weeks. If you do well with this can add a second dose to the mornings. See me back in 2 months to check back in.   Letter for jury duty  Lets make sure anemia not getting worse- Please stop by lab before you go  Please consider using ensure or similar at least twice a day to see if we can get your weight up some

## 2017-12-21 NOTE — Progress Notes (Signed)
Subjective:  TAINA LANDRY is a 77 y.o. year old very pleasant female patient who presents for/with See problem oriented charting ROS- continued left facial pain. No fever or chills. No nausea or vomiting. No chest pain or shortness of breath.    Past Medical History-  Patient Active Problem List   Diagnosis Date Noted  . 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 Protein-calorie malnutrition ([place]) 08/17/2017    Priority: Medium  . CAP (community acquired pneumonia) 03/15/2015    Priority: Medium  . Hyperlipidemia 09/24/2014    Priority: Medium  . Trigeminal neuralgia 09/24/2014    Priority: Medium  . Claudication ([place]) 06/26/2015    Priority: Low  . Lower extremity edema 06/26/2015    Priority: Low  . Shortness of breath 04/10/2015    Priority: Low  . History of skin cancer     Priority: Low  . Mild anemia 08/17/2017    Medications- reviewed and updated Current Outpatient Medications  Medication Sig Dispense Refill  . aspirin 325 MG tablet Take 325 mg by mouth daily.    . Multiple Vitamins-Minerals (ONE-A-DAY 50 PLUS PO) Take 1 tablet by mouth daily.     Objective: BP 130/70   Pulse 93   Temp 97.8 F (36.6 C) (Oral)   Ht 5\' 5"  (1.651 m)   Wt 101 lb 9.6 oz (46.1 kg)   SpO2 96%   BMI 16.91 kg/m  Gen: NAD, resting comfortably at times- appears in pain at other times with facial pain CV: RRR no murmurs rubs or gallops Lungs: CTAB no crackles, wheeze, rhonchi Abdomen: soft/nontender/nondistended/normal bowel sounds. Extremely thin Ext: no edema Skin: warm, dry  Assessment/Plan:  Trigeminal neuralgia S: Patient tried baclofen at 5mg  TID and had no help. She tried carbamezepine again that she had at home and had side effects including bad headaches and stiff neck.   Her dentist suggested a physician at [facility] that uses gabapentin for facial pain. She would like to try.   She continues to have intense bouts of facial pain A/P: we agreed to try  gabapentin up to 100mg  BID (see avs). I also considered lamictal per up to date. She is willing to return to neurology in the future if this doesn't help but would like a different evaluator/2nd opinion.   Mild anemia Mild anemia in past. Will update CBC> advised cologuard again as she has declined colonoscopy  Protein-calorie malnutrition ([place]) S: patient maintaining weight at 101. Very thin A/P: strongly encouraged her to try ensure or similar twice a day  Hyperlipidemia Update LDL today- hopefully under 100. Wouldn't feel strongly about starting statin unless signfificant LDL elevation   Lab/Order associations: Hyperlipidemia, unspecified hyperlipidemia type - Plan: LDL cholesterol, direct, Comprehensive metabolic panel  Mild anemia - Plan: CBC  Meds ordered this encounter  Medications  . gabapentin (NEURONTIN) 100 MG capsule    Sig: Take 1 capsule (100 mg total) by mouth 2 (two) times daily.    Dispense:  60 capsule    Refill:  3    Return precautions advised.  [name], MD

## 2017-12-21 NOTE — Assessment & Plan Note (Signed)
S: Patient tried baclofen at 5mg  TID and had no help. She tried carbamezepine again that she had at home and had side effects including bad headaches and stiff neck.   Her dentist suggested a physician at [facility] that uses gabapentin for facial pain. She would like to try.   She continues to have intense bouts of facial pain A/P: we agreed to try gabapentin up to 100mg  BID (see avs). I also considered lamictal per up to date. She is willing to return to neurology in the future if this doesn't help but would like a different evaluator/2nd opinion.

## 2017-12-21 NOTE — Assessment & Plan Note (Signed)
S: patient maintaining weight at 101. Very thin A/P: strongly encouraged her to try ensure or similar twice a day

## 2017-12-21 NOTE — Assessment & Plan Note (Signed)
Mild anemia in past. Will update CBC> advised cologuard again as she has declined colonoscopy

## 2018-02-22 ENCOUNTER — Ambulatory Visit: Payer: Medicare Other | Admitting: Family Medicine

## 2018-02-22 DIAGNOSIS — I4510 Unspecified right bundle-branch block: Secondary | ICD-10-CM | POA: Diagnosis not present

## 2018-02-22 DIAGNOSIS — M1711 Unilateral primary osteoarthritis, right knee: Secondary | ICD-10-CM | POA: Diagnosis not present

## 2018-02-22 DIAGNOSIS — M7121 Synovial cyst of popliteal space [Baker], right knee: Secondary | ICD-10-CM | POA: Diagnosis not present

## 2018-02-22 DIAGNOSIS — Z7982 Long term (current) use of aspirin: Secondary | ICD-10-CM | POA: Diagnosis not present

## 2018-02-22 DIAGNOSIS — M25561 Pain in right knee: Secondary | ICD-10-CM | POA: Diagnosis not present

## 2018-02-22 DIAGNOSIS — M7989 Other specified soft tissue disorders: Secondary | ICD-10-CM | POA: Diagnosis not present

## 2018-08-02 ENCOUNTER — Other Ambulatory Visit: Payer: Self-pay | Admitting: Family Medicine

## 2018-08-02 DIAGNOSIS — Z1231 Encounter for screening mammogram for malignant neoplasm of breast: Secondary | ICD-10-CM

## 2018-08-15 ENCOUNTER — Ambulatory Visit (INDEPENDENT_AMBULATORY_CARE_PROVIDER_SITE_OTHER): Payer: Medicare Other | Admitting: Family Medicine

## 2018-08-15 ENCOUNTER — Encounter: Payer: Self-pay | Admitting: Family Medicine

## 2018-08-15 VITALS — BP 148/70 | HR 96 | Temp 98.5°F | Ht 65.0 in | Wt 100.6 lb

## 2018-08-15 DIAGNOSIS — G500 Trigeminal neuralgia: Secondary | ICD-10-CM

## 2018-08-15 DIAGNOSIS — Z23 Encounter for immunization: Secondary | ICD-10-CM | POA: Diagnosis not present

## 2018-08-15 DIAGNOSIS — Z681 Body mass index (BMI) 19 or less, adult: Secondary | ICD-10-CM

## 2018-08-15 DIAGNOSIS — D649 Anemia, unspecified: Secondary | ICD-10-CM | POA: Diagnosis not present

## 2018-08-15 DIAGNOSIS — E440 Moderate protein-calorie malnutrition: Secondary | ICD-10-CM

## 2018-08-15 DIAGNOSIS — R030 Elevated blood-pressure reading, without diagnosis of hypertension: Secondary | ICD-10-CM

## 2018-08-15 DIAGNOSIS — I739 Peripheral vascular disease, unspecified: Secondary | ICD-10-CM

## 2018-08-15 DIAGNOSIS — I70208 Unspecified atherosclerosis of native arteries of extremities, other extremity: Secondary | ICD-10-CM

## 2018-08-15 NOTE — Assessment & Plan Note (Signed)
S: patient continues to deny any symptoms from this. Compliant with aspirin 325 mg. Her lipids have been ok without statin. Do want to control BP and we will recheck next visit as per elevated BP section A/P: continue to monitor BP . Continue aspirin 325 mg for now. Patient believes Dr. Arnoldo Morale started this related to this condition.

## 2018-08-15 NOTE — Progress Notes (Signed)
Subjective:  Brandy Conway is a 77 y.o. year old very pleasant female patient who presents for/with See problem oriented charting ROS- No chest pain or shortness of breath. No headache (other than jaw pain as noted below) or blurry vision.    Past Medical History-  Patient Active Problem List   Diagnosis Date Noted  . 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 Protein-calorie malnutrition (Brandy Conway) 08/17/2017    Priority: Medium  . CAP (community acquired pneumonia) 03/15/2015    Priority: Medium  . Hyperlipidemia 09/24/2014    Priority: Medium  . Trigeminal neuralgia 09/24/2014    Priority: Medium  . Mild anemia 08/17/2017    Priority: Low  . Claudication (Brandy Conway) 06/26/2015    Priority: Low  . Lower extremity edema 06/26/2015    Priority: Low  . Shortness of breath 04/10/2015    Priority: Low  . History of skin cancer     Priority: Low    Medications- reviewed and updated Current Outpatient Medications  Medication Sig Dispense Refill  . aspirin 325 MG tablet Take 325 mg by mouth daily.    . Multiple Vitamins-Minerals (ONE-A-DAY 50 PLUS PO) Take 1 tablet by mouth daily.     No current facility-administered medications for this visit.     Objective: BP (!) 148/70   Pulse 96   Temp 98.5 F (36.9 C) (Oral)   Ht 5\' 5"  (1.651 m)   Wt 100 lb 9.6 oz (45.6 kg)   SpO2 98%   BMI 16.74 kg/m  Gen: NAD, resting comfortably CV: RRR no murmurs rubs or gallops Lungs: CTAB no crackles, wheeze, rhonchi Abdomen: soft/nontender/nondistended/normal bowel sounds.  Ext: no edema Skin: warm, dry Thin through face, little subcutaneous fat- ribs visible  Assessment/Plan:  Elevated blood pressure S: no history of hypertension. BP high today for first time. She states very stressful morning. She is only one that can drive in [place] in her family as other family members not familiar and she was having issues this Am due to detour today. Has been in several stores while  here and also stress levels high in caring for brother with lymphoma BP Readings from Last 3 Encounters:  08/15/18 (!) 160/68--> 148/70 on repeat  12/21/17 130/70  11/18/17 (!) 126/54  A/P: We discussed blood pressure goal of <140-150/90. If remains high next time would call this HTN diagnosis but given her age 63 not add BP medicines as long as under 150  Mild anemia Last CBC reassuring- will recheck perhaps next labs  Claudication [facility]) S: patient with history of pain in legs with walking. She states that has been better lately. She rides a stationary bike which seems to help  Of note- Normal arterial study 06/2015 A/P: encouraged continued exercise- glad she is doing well  Protein-calorie malnutrition (Brandy Conway) S: noted very low BMI at 16.7. We have discussed this before- she at least has some ensures in her house but only using sparingly.  A/P: I encouraged patient to take daily ensure- she will consider. Discussed with long term issues with trigeminal neuralgia certainly understand the low weight but would prefer for her to have more reserves    Brachial artery stenosis, left (Brandy Conway) S: patient continues to deny any symptoms from this. Compliant with aspirin 325 mg. Her lipids have been ok without statin. Do want to control BP and we will recheck next visit as per elevated BP section A/P: continue to monitor BP . Continue aspirin 325 mg  for now. Patient believes Dr. Arnoldo Morale started this related to this condition.    Trigeminal neuralgia S:  has had some good days and other days that have been really bad. More good days than bad lately A/P: continue to monitor. She has not tolerated medication attempts- she prefers to just live with this   Future Appointments  Date Time Provider [place]  09/06/2018 11:30 AM [facility]  02/21/2019 11:00 AM Marin Olp, MD [facility]   Lab/Order associations: Need for prophylactic vaccination and inoculation  against influenza - Plan: Flu Vaccine QUAD 36+ mos IM  Return precautions advised.  [name], MD

## 2018-08-15 NOTE — Patient Instructions (Addendum)
Health Maintenance Due  Topic Date Due  . Brandy Conway - can get this at your pharmacy if you decide to  09/21/1960  . INFLUENZA VACCINE - prefers regular dose flu shot- given today 06/02/2018   Hold off on bloodwork this time but will update next time.   Great to see you today!  Brandy Conway

## 2018-08-15 NOTE — Assessment & Plan Note (Signed)
Last CBC reassuring- will recheck perhaps next labs

## 2018-08-15 NOTE — Assessment & Plan Note (Signed)
S: patient with history of pain in legs with walking. She states that has been better lately. She rides a stationary bike which seems to help  Of note- Normal arterial study 06/2015 A/P: encouraged continued exercise- glad she is doing well

## 2018-08-15 NOTE — Assessment & Plan Note (Signed)
S:  has had some good days and other days that have been really bad. More good days than bad lately A/P: continue to monitor. She has not tolerated medication attempts- she prefers to just live with this

## 2018-08-15 NOTE — Assessment & Plan Note (Signed)
S: noted very low BMI at 16.7. We have discussed this before- she at least has some ensures in her house but only using sparingly.  A/P: I encouraged patient to take daily ensure- she will consider. Discussed with long term issues with trigeminal neuralgia certainly understand the low weight but would prefer for her to have more reserves

## 2018-09-06 ENCOUNTER — Ambulatory Visit
Admission: RE | Admit: 2018-09-06 | Discharge: 2018-09-06 | Disposition: A | Discharge status: Home / Self Care | Payer: Medicare Other | Source: Ambulatory Visit | Attending: Family Medicine | Admitting: Family Medicine

## 2018-09-06 DIAGNOSIS — Z1231 Encounter for screening mammogram for malignant neoplasm of breast: Secondary | ICD-10-CM | POA: Diagnosis not present

## 2019-02-21 ENCOUNTER — Ambulatory Visit: Payer: Medicare Other | Admitting: Family Medicine

## 2019-04-03 IMAGING — MG DIGITAL SCREENING BILATERAL MAMMOGRAM WITH TOMO AND CAD
8 series · 9 of 24 positions shown · non-contrast
Comparison: Previous exam(s).

CLINICAL DATA: Screening.

EXAM:
DIGITAL SCREENING BILATERAL MAMMOGRAM WITH TOMO AND CAD

[R CC synth-2D]
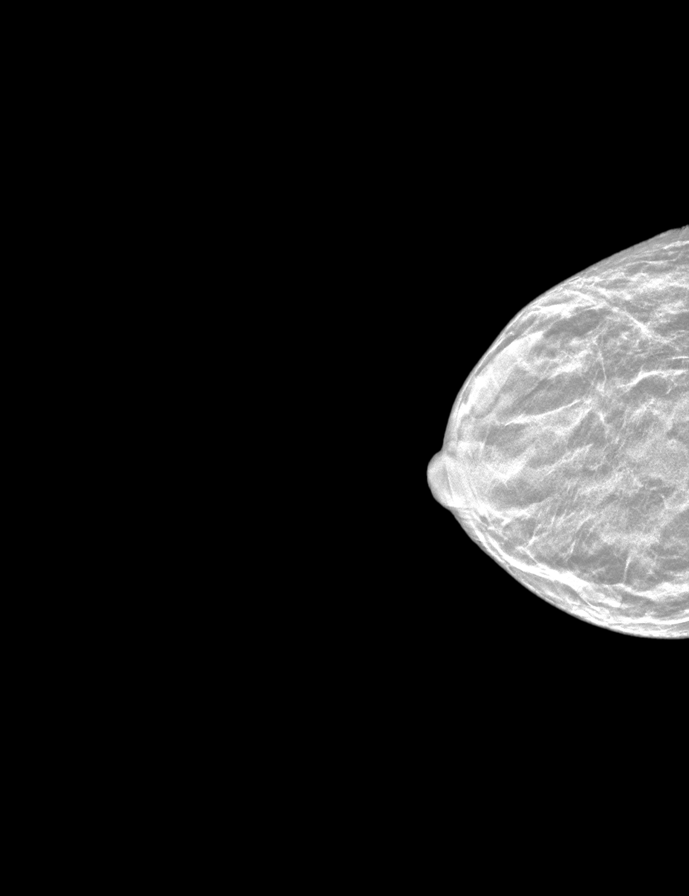

[L MLO synth-2D]
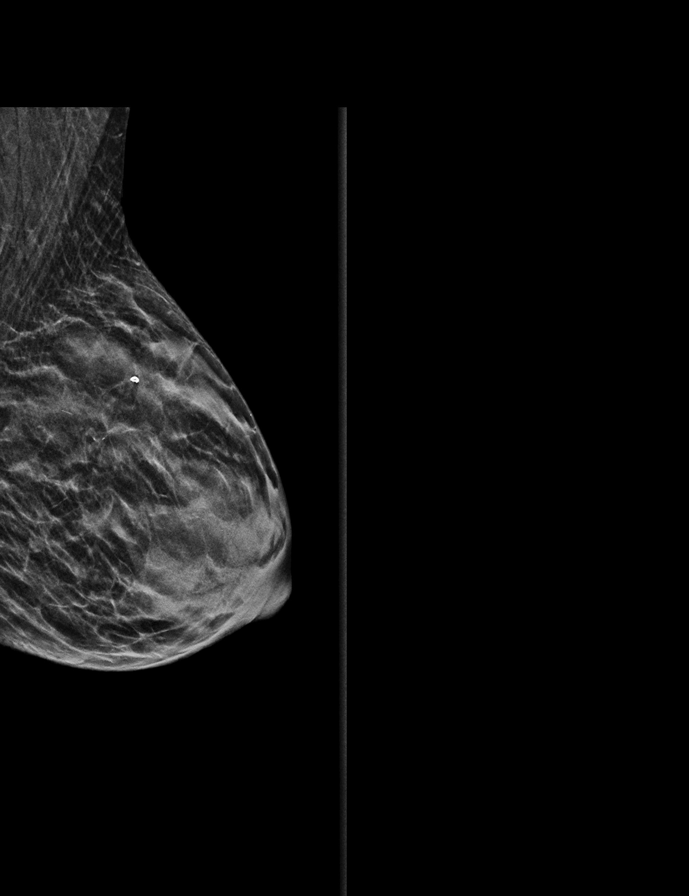

[R MLO synth-2D]
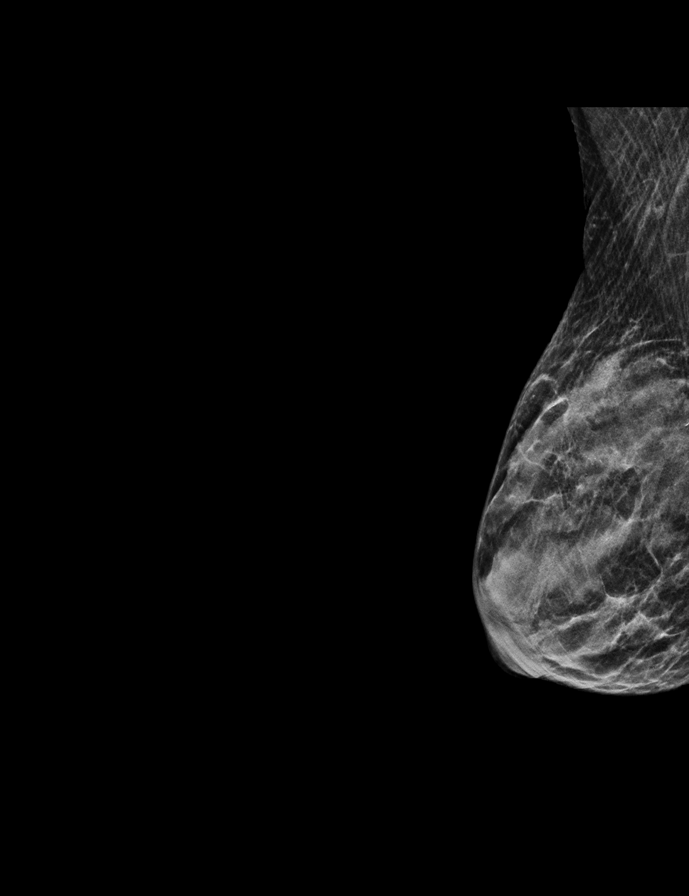

[L CC synth-2D]
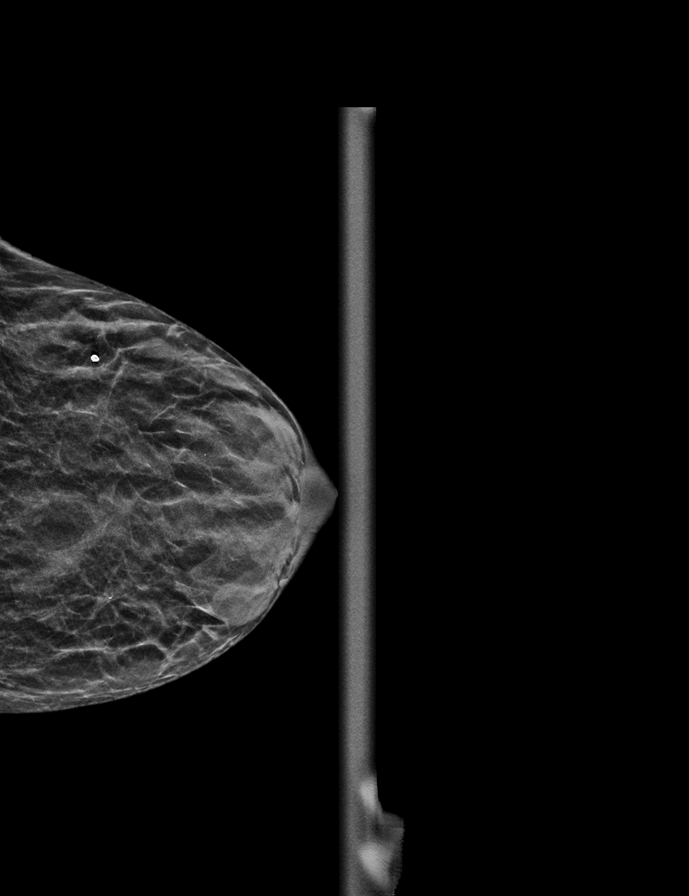

[L CC tomo · 2 of 27 frames shown]
[frame 9/27]
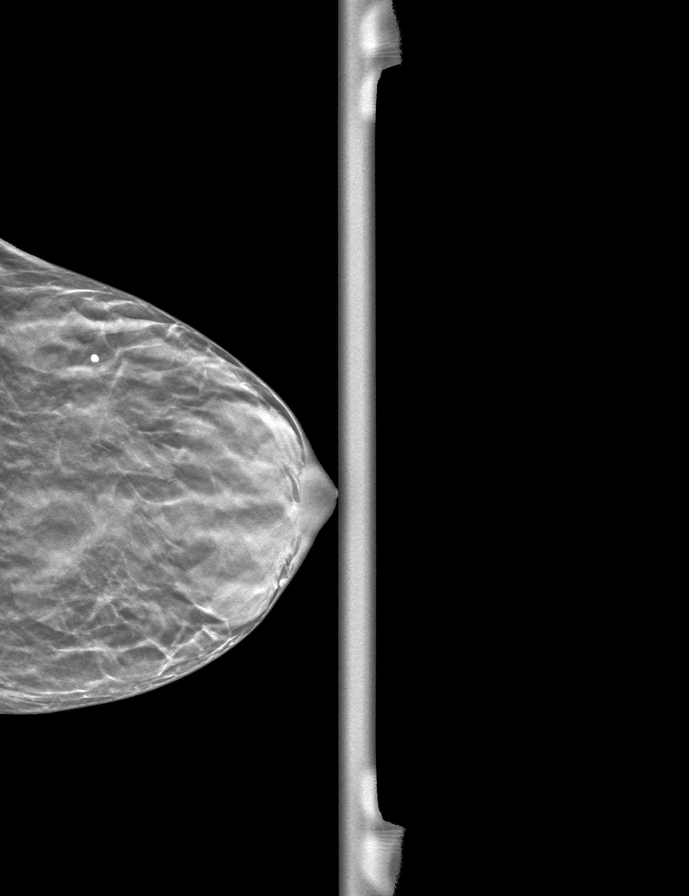
[frame 14/27]
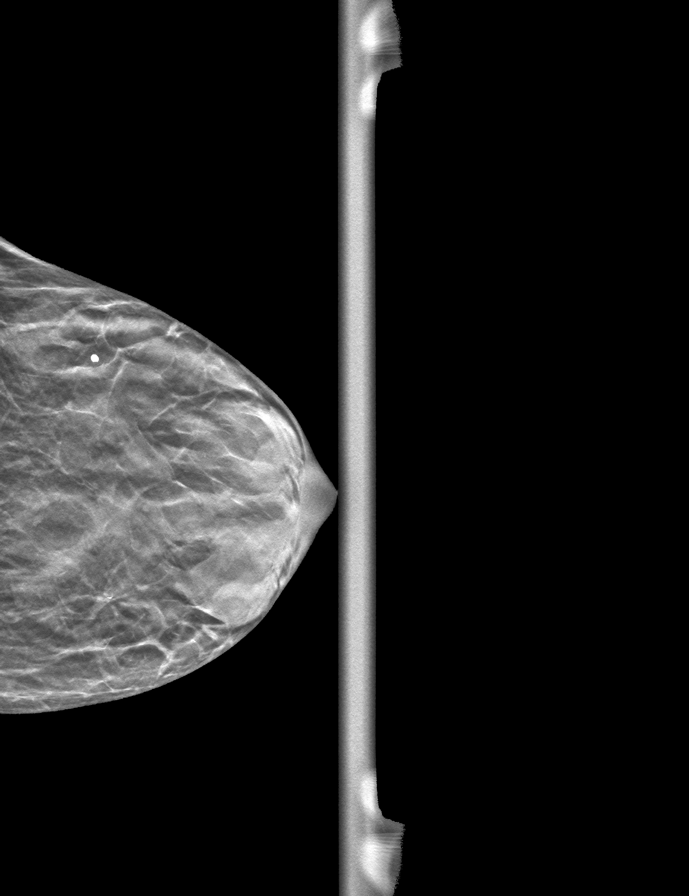

[R CC tomo · tomo slice 14/27.0]
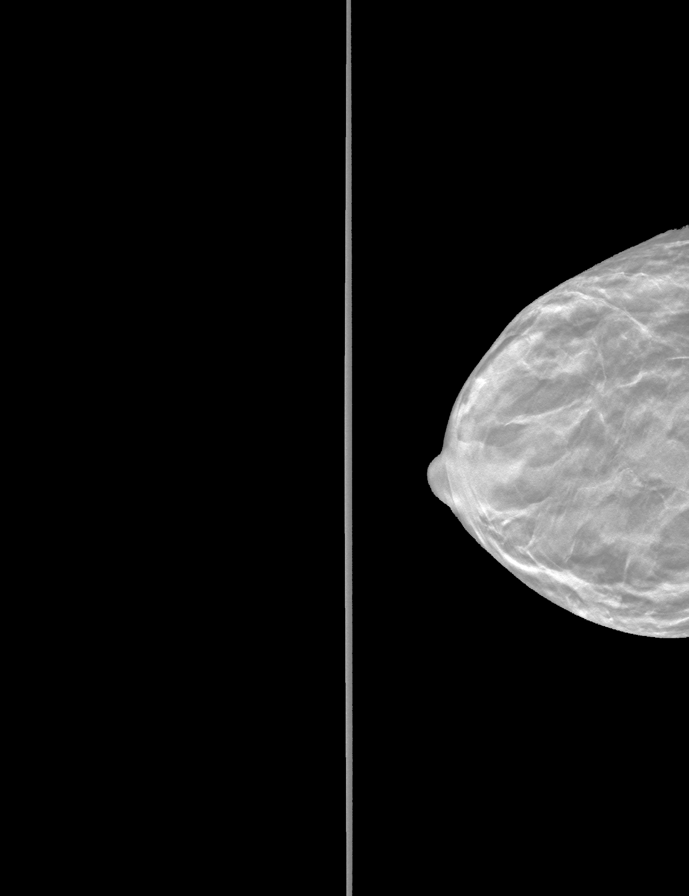

[L MLO tomo · tomo slice 15/29.0]
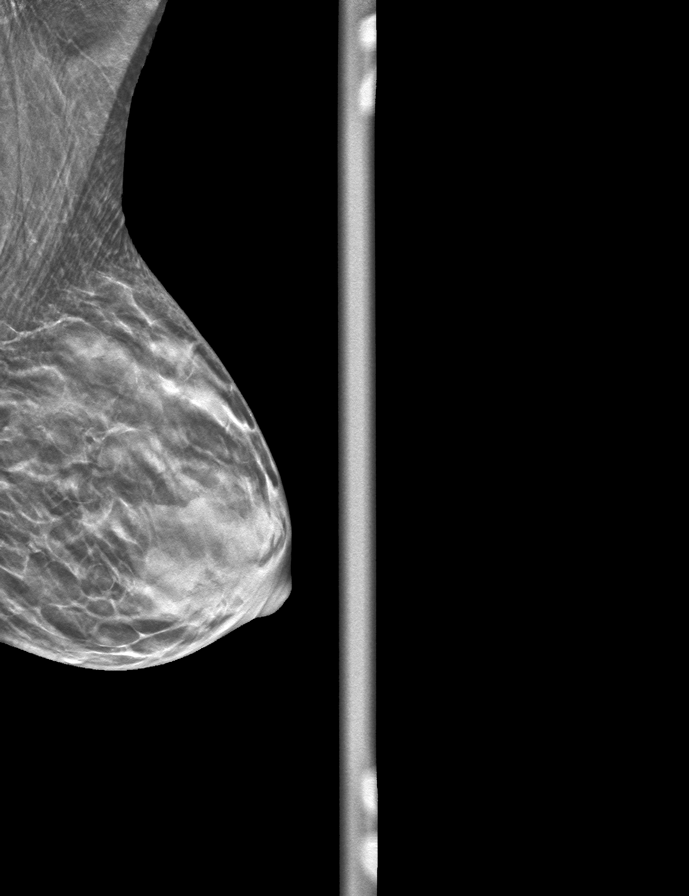

[R MLO tomo · tomo slice 15/29.0]
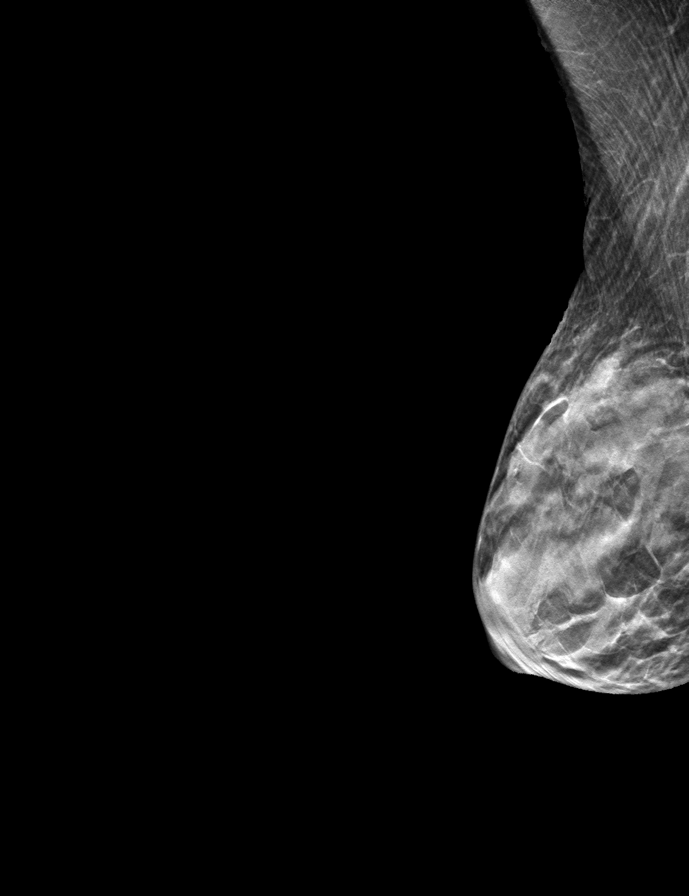

[9 of 24 positions shown; findings below may reference images not displayed]

ACR Breast Density Category d: The breast tissue is extremely dense,
which lowers the sensitivity of mammography.
FINDINGS: There are no findings suspicious for malignancy. Images were
processed with CAD.
IMPRESSION: No mammographic evidence of malignancy. A result letter of this
screening mammogram will be mailed directly to the patient.

RECOMMENDATION:
Screening mammogram in one year. (Code:RA-[id number])

BI-RADS CATEGORY  1: Negative.

## 2019-07-18 DIAGNOSIS — H5712 Ocular pain, left eye: Secondary | ICD-10-CM | POA: Diagnosis not present

## 2019-07-18 DIAGNOSIS — Z7982 Long term (current) use of aspirin: Secondary | ICD-10-CM | POA: Diagnosis not present

## 2019-07-18 DIAGNOSIS — H11422 Conjunctival edema, left eye: Secondary | ICD-10-CM | POA: Diagnosis not present

## 2019-07-19 ENCOUNTER — Telehealth: Payer: Self-pay | Admitting: Family Medicine

## 2019-07-19 NOTE — Telephone Encounter (Signed)
I called the patient to schedule AWV, but she was asleep.  I left my name and number for the patient to call me back directly at [phone]. VDM (Dee-Dee)

## 2019-07-31 ENCOUNTER — Other Ambulatory Visit: Payer: Self-pay | Admitting: Family Medicine

## 2019-07-31 DIAGNOSIS — Z1231 Encounter for screening mammogram for malignant neoplasm of breast: Secondary | ICD-10-CM

## 2019-08-07 DIAGNOSIS — Z23 Encounter for immunization: Secondary | ICD-10-CM | POA: Diagnosis not present

## 2019-08-24 NOTE — Patient Instructions (Addendum)
Health Maintenance Due  Topic Date Due  . TETANUS/TDAP - consider getting this done at your pharmacy 09/21/1960   Try to check some blood pressures at home and let us know what they are. If over 140/90 we may need to consider very low dose medicine to help bring this down. Possibly amlodipine  Your blood pressure trend concerns me. I would like for you to buy/use a home cuff to check at least 4x a week. Your goal is <140/90.    Also if cholesterol is high may consider once weekly cholesterol medicine  If have worsening eye pain- please let us know so we can try to get you in for a sooner visit with another eye doctor.   We will call you within two weeks about your referral to ENT. If you do not hear within 3 weeks, give [place] a call.    Please stop by lab before you go If you do not have mychart- we will call you about results within 5 business days of [place] receiving them.  If you have mychart- we will send your results within 3 business days of [place] receiving them.  If abnormal or we want to clarify a result, we will call or mychart you to make sure you receive the message.  If you have questions or concerns or don't hear within 5-7 days, please send [place] a message or call us.

## 2019-08-24 NOTE — Progress Notes (Signed)
Phone [phone]   Subjective:  Brandy Conway is a 78 y.o. year old very pleasant female patient who presents for/with See problem oriented charting Chief Complaint  Patient presents with  . Follow-up  . Hyperlipidemia  . trigeminal neuralgia   ROS- no chest pain or shortness of breath. Still with left jaw pain. Had some left eye pain- trying to get into optho.    Past Medical History-  Patient Active Problem List   Diagnosis Date Noted  . 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 Protein-calorie malnutrition ([place]) 08/17/2017    Priority: Medium  . CAP (community acquired pneumonia) 03/15/2015    Priority: Medium  . Hyperlipidemia 09/24/2014    Priority: Medium  . Trigeminal neuralgia 09/24/2014    Priority: Medium  . Mild anemia 08/17/2017    Priority: Low  . Claudication ([place]) 06/26/2015    Priority: Low  . Lower extremity edema 06/26/2015    Priority: Low  . Shortness of breath 04/10/2015    Priority: Low  . History of skin cancer     Priority: Low    Medications- reviewed and updated Current Outpatient Medications  Medication Sig Dispense Refill  . aspirin 325 MG tablet Take 325 mg by mouth daily.    . Multiple Vitamins-Minerals (ONE-A-DAY 50 PLUS PO) Take 1 tablet by mouth daily.    . Tdap (BOOSTRIX) 5-2.5-18.5 LF-MCG/0.5 injection Inject 0.5 mLs into the muscle once for 1 dose. 0.5 mL 0   No current facility-administered medications for this visit.      Objective:  BP (!) 144/68   Pulse 96   Temp (!) 97.1 F (36.2 C) (Temporal)   Ht 5\' 5"  (1.651 m)   Wt 102 lb 3.2 oz (46.4 kg)   SpO2 98%   BMI 17.01 kg/m  Gen: NAD, resting comfortably other than 1 minute period where grabs at left jaw.  Bilateral TM normal, left eye with minimal erythema comparable to left. No appreciable swelling around the eye.  CV: RRR no murmurs rubs or gallops Lungs: CTAB no crackles, wheeze, rhonchi Abdomen: soft/nontender/nondistended/normal bowel  sounds.  Ext: no edema Skin: warm, dry    Assessment and Plan   #social update- moved to [place] to be close to brother but now he is very sick- she feels somewhat alone. [employer] friends not as accessible due to covid 56.   #hyperlipidemia S: hyperlipidemia noted in the past. Last check had improved Lab Results  Component Value Date   CHOL 217 (H) 09/24/2014   HDL 96.00 09/24/2014   LDLCALC 110 (H) 09/24/2014   LDLDIRECT 82.0 12/21/2017   TRIG 54.0 09/24/2014   CHOLHDL 2 09/24/2014   A/P:  we will update lipids today and calculate ascvd risk. Discussed LDL under 70 goal now - have reconsidered goal in light of brachial artery stenosis- consider atorvastatin 20mg  once a week as she is hesitant about adding medicines.   # Left eye pain S:woke up and was hurting really bad in left eye back mid-September. Looked at eye and eye was swollen and black/blue despite no injury. She went to emergency room in [place].   Left eye still with some blood that is clearing but no longer having pain. CT orbits largely normal other than preseptal soft tissue swelling, degeneration at c1-odontoid articulation.   Since that time feels like left ear is stopped up and has noted hearing loss.  A/P: Left eye pain has resolved.  She states that  some mild erythema but looks comparable in both eyes-I still think ophthalmology follow-up is reasonable.  She would like their opinion on any connection between the eye pain/redness and trigeminal neuralgia-I thought pain could be related but did not think the erythema in the eye most likely was related.  Notes from [facility] suggest possibly she scratched her eye in her sleep without knowing it due to acute change back in September   Trigeminal Neuralgia- left side S:has seen neurology in past- has not tolerated medicine. Seems to come and go but is severe  She did have eye pain as below and has developed left sided hearing loss after period of left eye pain.  A/P: she  asks if this could be related to left sided hearing loss /trigeminal neuralgia- I told her I did not think so. MR brain without stroke or other abnormality in 04/2016 and has had trigeminal neuralgia for years.  With hearing loss- will get ENT opinion  # Brachial Artery stenosis- left side S: remains symptom free. She remains on aspirin 325 mg.  A/P: symptom free. Continue to monitor. Will try to reduce any potential atherosclerosis that could worsen this by considering statin  #Elevated blood pressure reading S: No current treatment BP Readings from Last 3 Encounters:  08/29/19 (!) 144/68  08/15/18 (!) 148/70  12/21/17 130/70  A/P: Blood pressure has been elevated the last 2 visits-I recommended home monitoring and update [place] within a month-also recommended 39-month in-person visit-consider low-dose amlodipine even possibly 1.25 mg due to patient concerned about side effects  Other notes: 1. under 5 but phq9 up slightly related to COVID-19-provided some counseling today given difficulty of current situation 2. she plans to schedule a visit with dermatology 3.  Protein calorie malnutrition-Up 2 lbs !  eating more portions but some junk including high salt foods.  Thankful for weight gain but food choices may be increasing blood pressure-tough balance here 4.  Claudication previously listed but not at present-she states she does not walk as quickly as she used to and wonders if that makes it less likely that is certainly possible.  Vascular work-up August 2016 5.  Once again declines bone density 6.  Recommended Tdap at pharmacy  Recommended follow up: 6 months recommended Future Appointments  Date Time Provider [place]  09/19/2019 10:20 AM [facility] MM 2 [facility]  02/20/2020  8:40 AM , Brayton Mars, MD [facility]    Lab/Order associations:   ICD-10-CM   1. Trigeminal neuralgia  G50.0   2. Hyperlipidemia, unspecified hyperlipidemia type  E78.5 CBC with  Differential/Platelet    Comprehensive metabolic panel    Lipid panel  3. Brachial artery stenosis, left (HCC)  I70.208   4. Hearing loss of left ear, unspecified hearing loss type  H91.92 Ambulatory referral to ENT  5. Claudication (HCC)  I73.9   6. Moderate protein-calorie malnutrition (HCC)  E44.0    Meds ordered this encounter  Medications  . Tdap (BOOSTRIX) 5-2.5-18.5 LF-MCG/0.5 injection    Sig: Inject 0.5 mLs into the muscle once for 1 dose.    Dispense:  0.5 mL    Refill:  0    Return precautions advised.  [name], MD

## 2019-08-29 ENCOUNTER — Encounter: Payer: Self-pay | Admitting: Family Medicine

## 2019-08-29 ENCOUNTER — Ambulatory Visit (INDEPENDENT_AMBULATORY_CARE_PROVIDER_SITE_OTHER): Payer: Medicare Other | Admitting: Family Medicine

## 2019-08-29 ENCOUNTER — Other Ambulatory Visit: Payer: Self-pay

## 2019-08-29 ENCOUNTER — Ambulatory Visit (INDEPENDENT_AMBULATORY_CARE_PROVIDER_SITE_OTHER): Payer: Medicare Other

## 2019-08-29 VITALS — BP 144/68 | HR 96 | Temp 97.1°F | Ht 65.0 in | Wt 102.2 lb

## 2019-08-29 VITALS — BP 128/70 | Ht 65.0 in | Wt 102.3 lb

## 2019-08-29 DIAGNOSIS — E440 Moderate protein-calorie malnutrition: Secondary | ICD-10-CM | POA: Diagnosis not present

## 2019-08-29 DIAGNOSIS — G500 Trigeminal neuralgia: Secondary | ICD-10-CM | POA: Diagnosis not present

## 2019-08-29 DIAGNOSIS — Z0000 Encounter for general adult medical examination without abnormal findings: Secondary | ICD-10-CM

## 2019-08-29 DIAGNOSIS — E785 Hyperlipidemia, unspecified: Secondary | ICD-10-CM

## 2019-08-29 DIAGNOSIS — I739 Peripheral vascular disease, unspecified: Secondary | ICD-10-CM

## 2019-08-29 DIAGNOSIS — H9192 Unspecified hearing loss, left ear: Secondary | ICD-10-CM

## 2019-08-29 DIAGNOSIS — I70208 Unspecified atherosclerosis of native arteries of extremities, other extremity: Secondary | ICD-10-CM

## 2019-08-29 LAB — COMPREHENSIVE METABOLIC PANEL
ALT: 14 U/L (ref 0–35)
AST: 18 U/L (ref 0–37)
Albumin: 4.2 g/dL (ref 3.5–5.2)
Alkaline Phosphatase: 50 U/L (ref 39–117)
BUN: 18 mg/dL (ref 6–23)
CO2: 32 mEq/L (ref 19–32)
Calcium: 9.6 mg/dL (ref 8.4–10.5)
Chloride: 102 mEq/L (ref 96–112)
Creatinine, Ser: 0.52 mg/dL (ref 0.40–1.20)
GFR: 114.07 mL/min (ref >60.00)
Glucose, Bld: 100 mg/dL — ABNORMAL HIGH (ref 70–99)
Potassium: 3.8 mEq/L (ref 3.5–5.1)
Sodium: 141 mEq/L (ref 135–145)
Total Bilirubin: 0.7 mg/dL (ref 0.2–1.2)
Total Protein: 6.7 g/dL (ref 6.0–8.3)

## 2019-08-29 LAB — CBC WITH DIFFERENTIAL/PLATELET
Basophils Absolute: 0 10*3/uL (ref 0.0–0.1)
Basophils Relative: 0.9 % (ref 0.0–3.0)
Eosinophils Absolute: 0 10*3/uL (ref 0.0–0.7)
Eosinophils Relative: 1.1 % (ref 0.0–5.0)
HCT: 39.6 % (ref 36.0–46.0)
Hemoglobin: 12.9 g/dL (ref 12.0–15.0)
Lymphocytes Relative: 24.3 % (ref 12.0–46.0)
Lymphs Abs: 1 10*3/uL (ref 0.7–4.0)
MCHC: 32.5 g/dL (ref 30.0–36.0)
MCV: 94 fl (ref 78.0–100.0)
Monocytes Absolute: 0.4 10*3/uL (ref 0.1–1.0)
Monocytes Relative: 8.9 % (ref 3.0–12.0)
Neutro Abs: 2.6 10*3/uL (ref 1.4–7.7)
Neutrophils Relative %: 64.8 % (ref 43.0–77.0)
Platelets: 246 10*3/uL (ref 150.0–400.0)
RBC: 4.22 Mil/uL (ref 3.87–5.11)
RDW: 13.6 % (ref 11.5–15.5)
WBC: 4 10*3/uL (ref 4.0–10.5)

## 2019-08-29 LAB — LIPID PANEL
Cholesterol: 215 mg/dL — ABNORMAL HIGH (ref 0–200)
HDL: 78.3 mg/dL (ref >39.00)
LDL Cholesterol: 120 mg/dL — ABNORMAL HIGH (ref 0–99)
NonHDL: 136.37
Total CHOL/HDL Ratio: 3
Triglycerides: 84 mg/dL (ref 0.0–149.0)
VLDL: 16.8 mg/dL (ref 0.0–40.0)

## 2019-08-29 MED ORDER — TETANUS-DIPHTH-ACELL PERTUSSIS 5-2.5-18.5 LF-MCG/0.5 IM SUSP: 0.5000 mL | Freq: Once | INTRAMUSCULAR | 0 refills | Status: AC

## 2019-08-29 NOTE — Assessment & Plan Note (Signed)
Protein calorie malnutrition-Up 2 lbs !  eating more portions but some junk including high salt foods.  Thankful for weight gain but food choices may be increasing blood pressure-tough balance here.  Will monitor

## 2019-08-29 NOTE — Progress Notes (Signed)
I have reviewed and agree with note, evaluation, plan.   Geramy Lamorte, MD  

## 2019-08-29 NOTE — Assessment & Plan Note (Signed)
Claudication previously listed but not at present-she states she does not walk as quickly as she used to and wonders if that makes it less likely that is certainly possible.  Vascular work-up August 2016

## 2019-08-29 NOTE — Progress Notes (Signed)
Subjective:   Brandy Conway is a 78 y.o. female who presents for Medicare Annual (Subsequent) preventive examination.  Review of Systems:   Cardiac Risk Factors include: advanced age (>5men, >3 women)    Objective:     Vitals: BP 128/70   Ht 5\' 5"  (1.651 m)   Wt 102 lb 4.7 oz (46.4 kg)   BMI 17.02 kg/m   Body mass index is 17.02 kg/m.  Advanced Directives 08/29/2019 06/22/2017 05/29/2016 05/14/2015 04/15/2015 11/08/2014  Does Patient Have a Medical Advance Directive? Yes Yes Yes Yes Yes No  Type of Advance Directive Living will;Healthcare Power of [place];Living will;Advance instruction for mental health treatment;Mental Health Advance Directive Living will -  Does patient want to make changes to medical advance directive? No - Patient declined - - No - Patient declined - -  Copy of [place] in Chart? No - copy requested - - No - copy requested - -  Would patient like information on creating a medical advance directive? - - - - - No - patient declined information    Tobacco Social History   Tobacco Use  Smoking Status Former Smoker  . Packs/day: 0.50  . Years: 16.00  . Pack years: 8.00  . Types: Cigarettes  . Quit date: 11/02/1978  . Years since quitting: 40.8  Smokeless Tobacco Never Used     Counseling given: Not Answered   Clinical Intake:  Pre-visit preparation completed: Yes  Pain : No/denies pain  Nutritional Status: BMI <19  Underweight Diabetes: No  How often do you need to have someone help you when you read instructions, pamphlets, or other written materials from your doctor or pharmacy?: 2 - Rarely  Interpreter Needed?: No  Information entered by :: Denman George LPN  Past Medical History:  Diagnosis Date  . Allergy   . Cancer ([place])   . Facial nerve sensory disorder   . History of skin cancer    Past Surgical History:  Procedure Laterality Date  . ABDOMINAL HYSTERECTOMY    . TONSILLECTOMY  AND ADENOIDECTOMY     Family History  Problem Relation Age of Onset  . Heart disease Mother        CHF  . Heart disease Brother 75       stents  . Asthma Maternal Grandmother   . Hypertension Maternal Grandmother   . Hypertension Paternal Grandmother   . Cancer Paternal Grandfather        liver  . Cancer Son        brain   . Breast cancer Neg Hx    Social History   Socioeconomic History  . Marital status: Widowed    Spouse name: Not on file  . Number of children: Not on file  . Years of education: Not on file  . Highest education level: Not on file  Occupational History  . Not on file  Social Needs  . Financial resource strain: Not on file  . Food insecurity    Worry: Not on file    Inability: Not on file  . Transportation needs    Medical: Not on file    Non-medical: Not on file  Tobacco Use  . Smoking status: Former Smoker    Packs/day: 0.50    Years: 16.00    Pack years: 8.00    Types: Cigarettes    Quit date: 11/02/1978    Years since quitting: 40.8  . Smokeless tobacco: Never Used  Substance and Sexual Activity  . Alcohol use: Yes    Alcohol/week: 7.0 standard drinks    Types: 7 Standard drinks or equivalent per week    Comment: rare  . Drug use: No  . Sexual activity: Never  Lifestyle  . Physical activity    Days per week: Not on file    Minutes per session: Not on file  . Stress: Not on file  Relationships  . Social Herbalist on phone: Not on file    Gets together: Not on file    Attends religious service: Not on file    Active member of club or organization: Not on file    Attends meetings of clubs or organizations: Not on file    Relationship status: Not on file  Other Topics Concern  . Not on file  Social History Narrative   Widowed in March 2015-sick for a long time. 1 son- lost at age 62.5 sudden death-malignant brain tumor ruptured. No pets.       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 aspirin 325 MG tablet Take 325 mg by mouth daily.  . Multiple Vitamins-Minerals (ONE-A-DAY 50 PLUS PO) Take 1 tablet by mouth daily.   No facility-administered encounter medications on file as of 08/29/2019.     Activities of Daily Living In your present state of health, do you have any difficulty performing the following activities: 08/29/2019 08/29/2019  Hearing? Tempie Donning  Vision? N N  Difficulty concentrating or making decisions? N N  Walking or climbing stairs? Y Y  Comment at times due to balance -  Dressing or bathing? N N  Doing errands, shopping? N N  Preparing Food and eating ? N -  Using the Toilet? N -  In the past six months, have you accidently leaked urine? N -  Do you have problems with loss of bowel control? N -  Managing your Medications? N -  Managing your Finances? N -  Housekeeping or managing your Housekeeping? N -  Some recent data might be hidden    Patient Care Team: Marin Olp, MD as PCP - General (Family Medicine)    Assessment:   This is a routine wellness examination for [facility]  Exercise Activities and Dietary recommendations Current Exercise Habits: The patient does not participate in regular exercise at present  Goals    . Gain weight     Gain weight; Try to eat often  Would also like to go to the mountains. Will take her niece     . patient     Wants her pain controled;         Fall Risk Fall Risk  08/29/2019 08/15/2018 06/22/2017 05/29/2016 12/27/2015  Falls in the past year? 0 No No No No  Number falls in past yr: 0 - - - -  Injury with Fall? 0 - - - -  Risk for fall due to : Impaired  mobility - - - -   Is the patient's home free of loose throw rugs in walkways, pet beds, electrical  cords, etc?   yes      Grab bars in the bathroom? yes      Handrails on the stairs?   yes      Adequate lighting?   yes  Timed Get Up and Go performed: completed and within normal timeframe; no gait abnormalities noted   Depression Screen PHQ 2/9 Scores 08/29/2019 08/15/2018 06/22/2017 05/29/2016  PHQ - 2 Score 4 0 1 0  PHQ- 9 Score 4 - - -     Cognitive Function MMSE - Mini Mental State Exam 05/29/2016  Not completed: (No Data)     6CIT Screen 08/29/2019  What Year? 0 points  What month? 0 points  What time? 0 points  Count back from 20 0 points  Months in reverse 0 points  Repeat phrase 0 points  Total Score 0    Immunization History  Administered Date(s) Administered  . Fluad Quad(high Dose 65+) 08/07/2019  . Influenza Whole 08/08/2007, 07/16/2010  . Influenza,inj,Quad PF,6+ Mos 07/19/2013, 07/21/2016, 08/17/2017, 08/15/2018  . Influenza-Unspecified 08/11/2014, 08/12/2015  . Pneumococcal Conjugate-13 09/24/2014  . Pneumococcal-Unspecified 09/02/2006    Qualifies for Shingles Vaccine?Discussed and patient will check with pharmacy for coverage.  Patient education handout provided   Screening Tests Health Maintenance  Topic Date Due  . TETANUS/TDAP  09/21/1960  . DEXA SCAN  03/14/2020 (Originally 09/21/2006)  . PNA vac Low Risk Adult (2 of 2 - PPSV23) 06/02/2044 (Originally 09/25/2015)  . INFLUENZA VACCINE  Completed    Cancer Screenings: Lung: Low Dose CT Chest recommended if Age 36-80 years, 30 pack-year currently smoking OR have quit w/in 15years. Patient does not qualify. Breast:  Up to date on Mammogram? Yes; scheduled for 09/19/19 Up to date of Bone Density/Dexa? No; patient will consider and call back for scheduling Colorectal: No longer indicated      Plan:  I have personally reviewed and addressed the Medicare Annual Wellness questionnaire and have noted  the following in the patient's chart:  A. Medical and social history B. Use of alcohol, tobacco or illicit drugs  C. Current medications and supplements D. Functional ability and status E.  Nutritional status F.  Physical activity G. Advance directives H. List of other physicians I.  Hospitalizations, surgeries, and ER visits in previous 12 months J.  [place] such as hearing and vision if needed, cognitive and depression L. Referrals, records requested, and appointments- none    In addition, I have reviewed and discussed with patient certain preventive protocols, quality metrics, and best practice recommendations. A written personalized care plan for preventive services as well as general preventive health recommendations were provided to patient.   Signed,  Denman George, LPN  Nurse Health Advisor   Nurse Notes: no additional

## 2019-08-29 NOTE — Patient Instructions (Signed)
Brandy Conway , Thank you for taking time to come for your Medicare Wellness Visit. I appreciate your ongoing commitment to your health goals. Please review the following plan we discussed and let me know if I can assist you in the future.   Screening recommendations/referrals: Colorectal Screening: No longer indicated Mammogram: up to date; scheduled for 09/19/19 Bone Density: recommended   Vision and Dental Exams: Recommended annual ophthalmology exams for early detection of glaucoma and other disorders of the eye Recommended annual dental exams for proper oral hygiene  Vaccinations: Influenza vaccine: completed 08/07/19 Pneumococcal vaccine: up to date; last 09/24/14 Tdap vaccine: recommended; Please call your insurance company to determine your out of pocket expense. You may receive this vaccine at your local pharmacy or Health Dept. Shingles vaccine: Please call your insurance company to determine your out of pocket expense for the Shingrix vaccine. You may receive this vaccine at your local pharmacy.  Advanced directives: Please bring a copy of your POA (Power of Attorney) and/or Living Will to your next appointment.  Goals: Recommend to drink at least 6-8 8oz glasses of water per day and consume a balanced diet rich in fresh fruits and vegetables.   Next appointment: Please schedule your Annual Wellness Visit with your Nurse Health Advisor in one year.  Preventive Care 78 Years and Older, Female Preventive care refers to lifestyle choices and visits with your health care provider that can promote health and wellness. What does preventive care include?  A yearly physical exam. This is also called an annual well check.  Dental exams once or twice a year.  Routine eye exams. Ask your health care provider how often you should have your eyes checked.  Personal lifestyle choices, including:  Daily care of your teeth and gums.  Regular physical activity.  Eating a healthy diet.   Avoiding tobacco and drug use.  Limiting alcohol use.  Practicing safe sex.  Taking low-dose aspirin every day if recommended by your health care provider.  Taking vitamin and mineral supplements as recommended by your health care provider. What happens during an annual well check? The services and screenings done by your health care provider during your annual well check will depend on your age, overall health, lifestyle risk factors, and family history of disease. Counseling  Your health care provider may ask you questions about your:  Alcohol use.  Tobacco use.  Drug use.  Emotional well-being.  Home and relationship well-being.  Sexual activity.  Eating habits.  History of falls.  Memory and ability to understand (cognition).  Work and work Statistician.  Reproductive health. Screening  You may have the following tests or measurements:  Height, weight, and BMI.  Blood pressure.  Lipid and cholesterol levels. These may be checked every 5 years, or more frequently if you are over 30 years old.  Skin check.  Lung cancer screening. You may have this screening every year starting at age 78 if you have a 30-pack-year history of smoking and currently smoke or have quit within the past 15 years.  Fecal occult blood test (FOBT) of the stool. You may have this test every year starting at age 45.  Flexible sigmoidoscopy or colonoscopy. You may have a sigmoidoscopy every 5 years or a colonoscopy every 10 years starting at age 45.  Hepatitis C blood test.  Hepatitis B blood test.  Sexually transmitted disease (STD) testing.  Diabetes screening. This is done by checking your blood sugar (glucose) after you have not eaten for a  while (fasting). You may have this done every 1-3 years.  Bone density scan. This is done to screen for osteoporosis. You may have this done starting at age 78.  Mammogram. This may be done every 1-2 years. Talk to your health care provider  about how often you should have regular mammograms. Talk with your health care provider about your test results, treatment options, and if necessary, the need for more tests. Vaccines  Your health care provider may recommend certain vaccines, such as:  Influenza vaccine. This is recommended every year.  Tetanus, diphtheria, and acellular pertussis (Tdap, Td) vaccine. You may need a Td booster every 10 years.  Zoster vaccine. You may need this after age 78.  Pneumococcal 13-valent conjugate (PCV13) vaccine. One dose is recommended after age 78.  Pneumococcal polysaccharide (PPSV23) vaccine. One dose is recommended after age 78. Talk to your health care provider about which screenings and vaccines you need and how often you need them. This information is not intended to replace advice given to you by your health care provider. Make sure you discuss any questions you have with your health care provider. Document Released: 11/15/2015 Document Revised: 07/08/2016 Document Reviewed: 08/20/2015 Elsevier Interactive Patient Education  2017 [employer] Prevention in the Home Falls can cause injuries. They can happen to people of all ages. There are many things you can do to make your home safe and to help prevent falls. What can I do on the outside of my home?  Regularly fix the edges of walkways and driveways and fix any cracks.  Remove anything that might make you trip as you walk through a door, such as a raised step or threshold.  Trim any bushes or trees on the path to your home.  Use bright outdoor lighting.  Clear any walking paths of anything that might make someone trip, such as rocks or tools.  Regularly check to see if handrails are loose or broken. Make sure that both sides of any steps have handrails.  Any raised decks and porches should have guardrails on the edges.  Have any leaves, snow, or ice cleared regularly.  Use sand or salt on walking paths during winter.   Clean up any spills in your garage right away. This includes oil or grease spills. What can I do in the bathroom?  Use night lights.  Install grab bars by the toilet and in the tub and shower. Do not use towel bars as grab bars.  Use non-skid mats or decals in the tub or shower.  If you need to sit down in the shower, use a plastic, non-slip stool.  Keep the floor dry. Clean up any water that spills on the floor as soon as it happens.  Remove soap buildup in the tub or shower regularly.  Attach bath mats securely with double-sided non-slip rug tape.  Do not have throw rugs and other things on the floor that can make you trip. What can I do in the bedroom?  Use night lights.  Make sure that you have a light by your bed that is easy to reach.  Do not use any sheets or blankets that are too big for your bed. They should not hang down onto the floor.  Have a firm chair that has side arms. You can use this for support while you get dressed.  Do not have throw rugs and other things on the floor that can make you trip. What can I do in the  kitchen?  Clean up any spills right away.  Avoid walking on wet floors.  Keep items that you use a lot in easy-to-reach places.  If you need to reach something above you, use a strong step stool that has a grab bar.  Keep electrical cords out of the way.  Do not use floor polish or wax that makes floors slippery. If you must use wax, use non-skid floor wax.  Do not have throw rugs and other things on the floor that can make you trip. What can I do with my stairs?  Do not leave any items on the stairs.  Make sure that there are handrails on both sides of the stairs and use them. Fix handrails that are broken or loose. Make sure that handrails are as long as the stairways.  Check any carpeting to make sure that it is firmly attached to the stairs. Fix any carpet that is loose or worn.  Avoid having throw rugs at the top or bottom of  the stairs. If you do have throw rugs, attach them to the floor with carpet tape.  Make sure that you have a light switch at the top of the stairs and the bottom of the stairs. If you do not have them, ask someone to add them for you. What else can I do to help prevent falls?  Wear shoes that:  Do not have high heels.  Have rubber bottoms.  Are comfortable and fit you well.  Are closed at the toe. Do not wear sandals.  If you use a stepladder:  Make sure that it is fully opened. Do not climb a closed stepladder.  Make sure that both sides of the stepladder are locked into place.  Ask someone to hold it for you, if possible.  Clearly mark and make sure that you can see:  Any grab bars or handrails.  First and last steps.  Where the edge of each step is.  Use tools that help you move around (mobility aids) if they are needed. These include:  Canes.  Walkers.  Scooters.  Crutches.  Turn on the lights when you go into a dark area. Replace any light bulbs as soon as they burn out.  Set up your furniture so you have a clear path. Avoid moving your furniture around.  If any of your floors are uneven, fix them.  If there are any pets around you, be aware of where they are.  Review your medicines with your doctor. Some medicines can make you feel dizzy. This can increase your chance of falling. Ask your doctor what other things that you can do to help prevent falls. This information is not intended to replace advice given to you by your health care provider. Make sure you discuss any questions you have with your health care provider. Document Released: 08/15/2009 Document Revised: 03/26/2016 Document Reviewed: 11/23/2014 Elsevier Interactive Patient Education  2017 [employer].

## 2019-09-19 ENCOUNTER — Other Ambulatory Visit: Payer: Self-pay

## 2019-09-19 ENCOUNTER — Ambulatory Visit
Admission: RE | Admit: 2019-09-19 | Discharge: 2019-09-19 | Disposition: A | Discharge status: Home / Self Care | Payer: Medicare Other | Source: Ambulatory Visit | Attending: Family Medicine | Admitting: Family Medicine

## 2019-09-19 DIAGNOSIS — Z1231 Encounter for screening mammogram for malignant neoplasm of breast: Secondary | ICD-10-CM

## 2019-10-24 ENCOUNTER — Ambulatory Visit (INDEPENDENT_AMBULATORY_CARE_PROVIDER_SITE_OTHER): Payer: Medicare Other | Admitting: Family Medicine

## 2019-10-24 ENCOUNTER — Encounter: Payer: Self-pay | Admitting: Family Medicine

## 2019-10-24 VITALS — BP 128/65 | Ht 65.0 in | Wt 102.0 lb

## 2019-10-24 DIAGNOSIS — I951 Orthostatic hypotension: Secondary | ICD-10-CM

## 2019-10-24 DIAGNOSIS — W19XXXA Unspecified fall, initial encounter: Secondary | ICD-10-CM

## 2019-10-24 DIAGNOSIS — S0003XA Contusion of scalp, initial encounter: Secondary | ICD-10-CM

## 2019-10-24 NOTE — Progress Notes (Signed)
Phone [phone] Virtual visit via phonenote   Subjective:   Chief Complaint  Patient presents with  . Follow-up    Seen in ED    This visit type was conducted due to national recommendations for restrictions regarding the COVID-19 Pandemic (e.g. social distancing).  This format is felt to be most appropriate for this patient at this time balancing risks to patient and risks to population by having him in for in person visit.  All issues noted in this document were discussed and addressed.  No physical exam was performed (except for noted visual exam or audio findings with Telehealth visits).  The patient has consented to conduct a Telehealth visit and understands insurance will be billed.   Our team/I connected with Brandy Conway at 11:40 AM EST by phone (patient did not have equipment for webex) and verified that I am speaking with the correct person using two identifiers.  Location patient: [facility] Location provider: [facility], office Persons participating in the virtual visit:  patient  Time on phone: 13 minutes Counseling provided about covid 19- she wants to be careful about coming in, precautions for follow up   Our team/I discussed the limitations of evaluation and management by telemedicine and the availability of in person appointments. In light of current covid-19 pandemic, patient also understands that we are trying to protect them by minimizing in office contact if at all possible.  The patient expressed consent for telemedicine visit and agreed to proceed. Patient understands insurance will be billed.   ROS- Review of Systems  Constitutional: Negative.   HENT: Positive for hearing loss.        No changes history of hearing loss   Eyes: Negative.   Respiratory: Negative.   Cardiovascular: Negative.   Gastrointestinal: Negative.   Genitourinary: Negative.   Musculoskeletal: Negative.   Skin: Negative.   Neurological: Positive for dizziness and headaches.       Had  some dizziness this morning when she first got out of bed. No more after that.  Headache resolved after she was able to remove the dressing from head.   Endo/Heme/Allergies: Negative.   Psychiatric/Behavioral: Negative.      Past Medical History-  Patient Active Problem List   Diagnosis Date Noted  . 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 Protein-calorie malnutrition ([place]) 08/17/2017    Priority: Medium  . CAP (community acquired pneumonia) 03/15/2015    Priority: Medium  . Hyperlipidemia 09/24/2014    Priority: Medium  . Trigeminal neuralgia 09/24/2014    Priority: Medium  . Mild anemia 08/17/2017    Priority: Low  . Claudication ([place]) 06/26/2015    Priority: Low  . Lower extremity edema 06/26/2015    Priority: Low  . Shortness of breath 04/10/2015    Priority: Low  . History of skin cancer     Priority: Low    Medications- reviewed and updated Current Outpatient Medications  Medication Sig Dispense Refill  . aspirin 325 MG tablet Take 325 mg by mouth daily.    . Multiple Vitamins-Minerals (ONE-A-DAY 50 PLUS PO) Take 1 tablet by mouth daily.     No current facility-administered medications for this visit.     Objective:  BP 128/65 Comment: at hospital  Ht 5\' 5"  (1.651 m)   Wt 102 lb (46.3 kg)   BMI 16.97 kg/m  self reported vitals  Nonlabored voice, normal speech      Assessment and Plan    #  Fall/syncope S: On the 19th when patient woke up from sleep, Patient got up to go to rest room and while standing at the sink started to feel lightheaded/dizzy/flushed. She then passed out. She is not sure how long she was out but by times did not think it was prolonged. She did have injury to head and right elbow. She did have stitches in right elbow in the emergency room. Pain is improving in the elbow. Hematoma on scalp is going down. She is right handed so that messes her up some with activities at home.   In the ER, they thought she may  be dehydrated- given normal saline bolus.   She was told to go back to ED in 14 days to get  Sutures removed. She is doing well today but was told to follow up with PCP. She did have some headache but resolved after she removed dressing that was on her head. She has some soreness but not to the point where she has needed any medications for pain.    From [facility] health care report "78 yo female presents with syncopal episode that caused a laceration to her right elbow and a scalp hematoma with abrasion. Head and cervical spine CTs are negative for acute findings apart from hematoma. EKG shows sinus tach with RBBB. Labs overall reassuring with mildly increased BUN of 27. Patient was given NS 500 ml bolus in the ED, and her tetanus was updated. She is advised to return to the ED in 2 weeks for suture removal."  A/P: 78 year old female with suspected orthostatic hypotension leading to syncopal event.  Patient woke up in the middle the night and passed out in the bathroom after feeling dizzy/flushed.  No chest pain or shortness of breath.  Had appropriate work-up in the emergency room-she did hit her head but had head CT at that time and has had no lingering headaches.  EKG was reassuring per ED report. -We discussed potential cardiac etiology and offered echocardiogram or could consider cardiology referral-patient prefers to hold off for now particular with COVID-19 but agrees if she has recurrent issues to seek care in the emergency room. -I think orthostatic hypotension is the most likely cause particularly with elevated BUN and good response to normal saline in the hospital.  Recommended she increase her hydration on a regular basis-she agrees to try -Reports scalp hematoma improving -Advised her to keep follow-up with [facility] for suture removal since she lives closer to that location  Recommended follow up: Keep already scheduled appointment Future Appointments  Date Time Provider [place]   02/20/2020  8:40 AM Marin Olp, MD [facility]    Lab/Order associations:   ICD-10-CM   1. Orthostatic syncope  I95.1   2. Fall, initial encounter  W19.XXXA   3. Hematoma of scalp, initial encounter  S00.03XA    Return precautions advised.  [name], MD

## 2019-10-24 NOTE — Patient Instructions (Signed)
There are no preventive care reminders to display for this patient.  Depression screen [facility] 2/9 08/29/2019 08/15/2018 06/22/2017  Decreased Interest 2 0 1  Down, Depressed, Hopeless 2 0 0  PHQ - 2 Score 4 0 1  Altered sleeping 0 - -  Tired, decreased energy 0 - -  Change in appetite 0 - -  Feeling bad or failure about yourself  0 - -  Trouble concentrating 0 - -  Moving slowly or fidgety/restless 0 - -  Suicidal thoughts 0 - -  PHQ-9 Score 4 - -  Difficult doing work/chores Not difficult at all - -    Recommended follow up: No follow-ups on file.

## 2019-10-30 ENCOUNTER — Ambulatory Visit: Payer: Self-pay

## 2019-10-30 ENCOUNTER — Telehealth: Payer: Self-pay | Admitting: Family Medicine

## 2019-10-30 NOTE — Telephone Encounter (Signed)
error 

## 2019-10-30 NOTE — Telephone Encounter (Signed)
Recommend ED.  Algis Greenhouse. Jerline Pain, MD 10/30/2019 9:58 AM

## 2019-10-30 NOTE — Telephone Encounter (Signed)
Per Dr Jerline Pain I have called patient and advised to go to ED. She is working with trying to get ride to ED. If symptoms get worse or change she will call 911.

## 2019-10-30 NOTE — Telephone Encounter (Signed)
Pt. Called to report episode of dizziness, after she got up this morning.  Stated she felt like she could pass out, and she lowered herself to the floor.  Reported she felt her heart racing, and she thought it was due to anxiety, at the time.  Felt that the episode lasted about 5 minutes.  Stated she doesn't think her head has felt right since she had an episode in October, with bruising around one of her eyes.  At present is sitting, and denied feeling dizzy.  Drinking water and coffee.  Has had approx. 8 oz. Water this AM. Denied speech difficulty, or disorientation, or weakness in left arm.  Stated my "left leg has felt different; left foot and toes have felt numb."  Denied any weakness of right extremities.  Denied headache, or vision changes.  denied chest pain, nausea or vomiting.  Called FC at office.  Requested to have Dr. Yong Channel advise ER vs. Coming to office for eval.  Stated she will have Dr. Ansel Bong nurse review the Triage note, and call pt. back .  Pt. Advised to rest, continue to hydrate, and await call from office.  Advised that she should call back to office if has not received return call in next hour.  Pt. Verb. Understanding.         Reason for Disposition . SEVERE dizziness (e.g., unable to stand, requires support to walk, feels like passing out now)    Episode of dizziness this AM; felt like she could pass out; lowered herself to the floor and it subsided; maybe about 5 min.  Answer Assessment - Initial Assessment Questions 1. DESCRIPTION: "Describe your dizziness."     "I felt light headed and like everything was going to black out"  2. LIGHTHEADED: "Do you feel lightheaded?" (e.g., somewhat faint, woozy, weak upon standing)     See above; eased herself down to floor 3. VERTIGO: "Do you feel like either you or the room is spinning or tilting?" (i.e. vertigo)     Denied 4. SEVERITY: "How bad is it?"  "Do you feel like you are going to faint?" "Can you stand and walk?"   -  MILD - walking normally   - MODERATE - interferes with normal activities (e.g., work, school)    - SEVERE - unable to stand, requires support to walk, feels like passing out now.     Moderate to severe 5. ONSET:  "When did the dizziness begin?"     When she woke up this morning; had an episode last week  6. AGGRAVATING FACTORS: "Does anything make it worse?" (e.g., standing, change in head position)    Standing  7. HEART RATE: "Can you tell me your heart rate?" "How many beats in 15 seconds?"  (Note: not all patients can do this)       Stated her heart felt like it was racing this morning when she felt dizziness 8. CAUSE: "What do you think is causing the dizziness?"    unknown 9. RECURRENT SYMPTOM: "Have you had dizziness before?" If so, ask: "When was the last time?" "What happened that time?"     Last week; caused her to faint  10. OTHER SYMPTOMS: "Do you have any other symptoms?" (e.g., fever, chest pain, vomiting, diarrhea, bleeding)      Denied loss of vision; I haven't felt normal; no difficulty with speech; left foot and toes feel numb; left arm feels normal; denied headache; denied chest pain, n/ v  11. PREGNANCY: "Is there  any chance you are pregnant?" "When was your last menstrual period?"      n/a  Protocols used: DIZZINESS [facility]

## 2019-10-30 NOTE — Telephone Encounter (Signed)
See note

## 2019-11-13 ENCOUNTER — Telehealth: Payer: Self-pay | Admitting: Family Medicine

## 2019-11-13 NOTE — Telephone Encounter (Signed)
Patient called [place] back and wanted to know if someone could call her back when they are available

## 2019-11-13 NOTE — Telephone Encounter (Signed)
Tried calling pt back 3 times and the number provided rang busy each time.

## 2019-11-13 NOTE — Telephone Encounter (Signed)
Patient called in saying she has been passing out recently and having really bad dizzy spells, been to the ED multiple times but they just advise that she follows up with [place].

## 2019-11-15 NOTE — Telephone Encounter (Signed)
Called patient she is transferring care to office near her home. Have removed Dr Yong Channel as PCP

## 2019-11-15 NOTE — Telephone Encounter (Signed)
Thanks for update

## 2019-11-16 DIAGNOSIS — Z23 Encounter for immunization: Secondary | ICD-10-CM | POA: Diagnosis not present

## 2019-11-19 DIAGNOSIS — R42 Dizziness and giddiness: Secondary | ICD-10-CM | POA: Diagnosis not present

## 2019-11-19 DIAGNOSIS — F419 Anxiety disorder, unspecified: Secondary | ICD-10-CM | POA: Diagnosis not present

## 2019-11-19 DIAGNOSIS — I70208 Unspecified atherosclerosis of native arteries of extremities, other extremity: Secondary | ICD-10-CM | POA: Diagnosis not present

## 2019-11-19 DIAGNOSIS — I441 Atrioventricular block, second degree: Secondary | ICD-10-CM | POA: Diagnosis not present

## 2019-11-19 DIAGNOSIS — E46 Unspecified protein-calorie malnutrition: Secondary | ICD-10-CM | POA: Diagnosis not present

## 2019-11-19 DIAGNOSIS — I4510 Unspecified right bundle-branch block: Secondary | ICD-10-CM | POA: Diagnosis not present

## 2019-11-19 DIAGNOSIS — Z20822 Contact with and (suspected) exposure to covid-19: Secondary | ICD-10-CM | POA: Diagnosis not present

## 2019-11-19 DIAGNOSIS — I493 Ventricular premature depolarization: Secondary | ICD-10-CM | POA: Diagnosis not present

## 2019-11-19 DIAGNOSIS — G500 Trigeminal neuralgia: Secondary | ICD-10-CM | POA: Diagnosis not present

## 2019-11-19 DIAGNOSIS — H547 Unspecified visual loss: Secondary | ICD-10-CM | POA: Diagnosis not present

## 2019-11-19 DIAGNOSIS — R55 Syncope and collapse: Secondary | ICD-10-CM | POA: Diagnosis not present

## 2019-11-19 DIAGNOSIS — R0602 Shortness of breath: Secondary | ICD-10-CM | POA: Diagnosis not present

## 2019-11-19 DIAGNOSIS — R001 Bradycardia, unspecified: Secondary | ICD-10-CM | POA: Diagnosis not present

## 2019-11-19 DIAGNOSIS — Z7982 Long term (current) use of aspirin: Secondary | ICD-10-CM | POA: Diagnosis not present

## 2019-11-20 DIAGNOSIS — I208 Other forms of angina pectoris: Secondary | ICD-10-CM | POA: Diagnosis present

## 2019-11-20 DIAGNOSIS — Z950 Presence of cardiac pacemaker: Secondary | ICD-10-CM | POA: Diagnosis not present

## 2019-11-20 DIAGNOSIS — I4510 Unspecified right bundle-branch block: Secondary | ICD-10-CM | POA: Diagnosis present

## 2019-11-20 DIAGNOSIS — Z45018 Encounter for adjustment and management of other part of cardiac pacemaker: Secondary | ICD-10-CM | POA: Diagnosis not present

## 2019-11-20 DIAGNOSIS — Z8679 Personal history of other diseases of the circulatory system: Secondary | ICD-10-CM | POA: Diagnosis not present

## 2019-11-20 DIAGNOSIS — E860 Dehydration: Secondary | ICD-10-CM | POA: Diagnosis present

## 2019-11-20 DIAGNOSIS — E46 Unspecified protein-calorie malnutrition: Secondary | ICD-10-CM | POA: Diagnosis not present

## 2019-11-20 DIAGNOSIS — Z8669 Personal history of other diseases of the nervous system and sense organs: Secondary | ICD-10-CM | POA: Diagnosis not present

## 2019-11-20 DIAGNOSIS — I341 Nonrheumatic mitral (valve) prolapse: Secondary | ICD-10-CM | POA: Diagnosis not present

## 2019-11-20 DIAGNOSIS — Z7982 Long term (current) use of aspirin: Secondary | ICD-10-CM | POA: Diagnosis not present

## 2019-11-20 DIAGNOSIS — R55 Syncope and collapse: Secondary | ICD-10-CM | POA: Diagnosis not present

## 2019-11-20 DIAGNOSIS — I70208 Unspecified atherosclerosis of native arteries of extremities, other extremity: Secondary | ICD-10-CM | POA: Diagnosis present

## 2019-11-20 DIAGNOSIS — R001 Bradycardia, unspecified: Secondary | ICD-10-CM

## 2019-11-20 DIAGNOSIS — I708 Atherosclerosis of other arteries: Secondary | ICD-10-CM | POA: Diagnosis not present

## 2019-11-20 DIAGNOSIS — J984 Other disorders of lung: Secondary | ICD-10-CM | POA: Diagnosis not present

## 2019-11-20 DIAGNOSIS — R079 Chest pain, unspecified: Secondary | ICD-10-CM | POA: Diagnosis not present

## 2019-11-20 DIAGNOSIS — I10 Essential (primary) hypertension: Secondary | ICD-10-CM | POA: Diagnosis not present

## 2019-11-20 DIAGNOSIS — R5381 Other malaise: Secondary | ICD-10-CM | POA: Diagnosis not present

## 2019-11-20 DIAGNOSIS — Z79899 Other long term (current) drug therapy: Secondary | ICD-10-CM | POA: Diagnosis not present

## 2019-11-20 DIAGNOSIS — R918 Other nonspecific abnormal finding of lung field: Secondary | ICD-10-CM | POA: Diagnosis not present

## 2019-11-20 DIAGNOSIS — E785 Hyperlipidemia, unspecified: Secondary | ICD-10-CM | POA: Diagnosis present

## 2019-11-20 DIAGNOSIS — R350 Frequency of micturition: Secondary | ICD-10-CM | POA: Diagnosis not present

## 2019-11-20 DIAGNOSIS — I441 Atrioventricular block, second degree: Secondary | ICD-10-CM

## 2019-11-20 DIAGNOSIS — Z888 Allergy status to other drugs, medicaments and biological substances status: Secondary | ICD-10-CM | POA: Diagnosis not present

## 2019-11-20 DIAGNOSIS — I442 Atrioventricular block, complete: Secondary | ICD-10-CM | POA: Diagnosis present

## 2019-11-20 HISTORY — DX: Bradycardia, unspecified: R00.1

## 2019-11-20 HISTORY — DX: Atrioventricular block, second degree: I44.1

## 2019-11-29 DIAGNOSIS — I441 Atrioventricular block, second degree: Secondary | ICD-10-CM | POA: Diagnosis not present

## 2019-11-29 DIAGNOSIS — R5383 Other fatigue: Secondary | ICD-10-CM | POA: Diagnosis not present

## 2019-11-30 DIAGNOSIS — I509 Heart failure, unspecified: Secondary | ICD-10-CM | POA: Diagnosis not present

## 2019-11-30 DIAGNOSIS — Z45018 Encounter for adjustment and management of other part of cardiac pacemaker: Secondary | ICD-10-CM | POA: Diagnosis not present

## 2019-12-12 DIAGNOSIS — R5383 Other fatigue: Secondary | ICD-10-CM | POA: Diagnosis not present

## 2019-12-14 DIAGNOSIS — Z23 Encounter for immunization: Secondary | ICD-10-CM | POA: Diagnosis not present

## 2019-12-20 DIAGNOSIS — Z45010 Encounter for checking and testing of cardiac pacemaker pulse generator [battery]: Secondary | ICD-10-CM | POA: Diagnosis not present

## 2019-12-20 DIAGNOSIS — R1907 Generalized intra-abdominal and pelvic swelling, mass and lump: Secondary | ICD-10-CM | POA: Diagnosis not present

## 2019-12-20 DIAGNOSIS — I442 Atrioventricular block, complete: Secondary | ICD-10-CM | POA: Diagnosis not present

## 2019-12-20 DIAGNOSIS — Z45018 Encounter for adjustment and management of other part of cardiac pacemaker: Secondary | ICD-10-CM | POA: Diagnosis not present

## 2019-12-25 DIAGNOSIS — K869 Disease of pancreas, unspecified: Secondary | ICD-10-CM | POA: Diagnosis not present

## 2019-12-25 DIAGNOSIS — N1330 Unspecified hydronephrosis: Secondary | ICD-10-CM | POA: Diagnosis not present

## 2019-12-25 DIAGNOSIS — R5383 Other fatigue: Secondary | ICD-10-CM | POA: Diagnosis not present

## 2019-12-25 DIAGNOSIS — R140 Abdominal distension (gaseous): Secondary | ICD-10-CM | POA: Diagnosis not present

## 2019-12-25 DIAGNOSIS — K862 Cyst of pancreas: Secondary | ICD-10-CM | POA: Diagnosis not present

## 2020-01-02 DIAGNOSIS — R634 Abnormal weight loss: Secondary | ICD-10-CM | POA: Diagnosis not present

## 2020-01-02 DIAGNOSIS — R935 Abnormal findings on diagnostic imaging of other abdominal regions, including retroperitoneum: Secondary | ICD-10-CM

## 2020-01-02 DIAGNOSIS — I70208 Unspecified atherosclerosis of native arteries of extremities, other extremity: Secondary | ICD-10-CM | POA: Diagnosis not present

## 2020-01-02 DIAGNOSIS — I50810 Right heart failure, unspecified: Secondary | ICD-10-CM | POA: Diagnosis not present

## 2020-01-02 DIAGNOSIS — I441 Atrioventricular block, second degree: Secondary | ICD-10-CM | POA: Diagnosis not present

## 2020-01-02 DIAGNOSIS — R140 Abdominal distension (gaseous): Secondary | ICD-10-CM | POA: Diagnosis not present

## 2020-01-02 DIAGNOSIS — R5383 Other fatigue: Secondary | ICD-10-CM | POA: Diagnosis not present

## 2020-01-02 HISTORY — DX: Abnormal findings on diagnostic imaging of other abdominal regions, including retroperitoneum: R93.5

## 2020-01-04 DIAGNOSIS — I50810 Right heart failure, unspecified: Secondary | ICD-10-CM | POA: Diagnosis not present

## 2020-01-04 DIAGNOSIS — Z45018 Encounter for adjustment and management of other part of cardiac pacemaker: Secondary | ICD-10-CM | POA: Diagnosis not present

## 2020-01-23 DIAGNOSIS — R140 Abdominal distension (gaseous): Secondary | ICD-10-CM | POA: Diagnosis not present

## 2020-01-23 DIAGNOSIS — R5383 Other fatigue: Secondary | ICD-10-CM | POA: Diagnosis not present

## 2020-01-23 DIAGNOSIS — R935 Abnormal findings on diagnostic imaging of other abdominal regions, including retroperitoneum: Secondary | ICD-10-CM | POA: Diagnosis not present

## 2020-01-23 DIAGNOSIS — R634 Abnormal weight loss: Secondary | ICD-10-CM | POA: Diagnosis not present

## 2020-02-20 ENCOUNTER — Ambulatory Visit: Payer: Medicare Other | Admitting: Family Medicine

## 2020-03-06 DIAGNOSIS — R55 Syncope and collapse: Secondary | ICD-10-CM | POA: Diagnosis not present

## 2020-03-06 DIAGNOSIS — Z7982 Long term (current) use of aspirin: Secondary | ICD-10-CM | POA: Diagnosis not present

## 2020-03-06 DIAGNOSIS — E785 Hyperlipidemia, unspecified: Secondary | ICD-10-CM | POA: Diagnosis not present

## 2020-03-06 DIAGNOSIS — F410 Panic disorder [episodic paroxysmal anxiety] without agoraphobia: Secondary | ICD-10-CM | POA: Diagnosis not present

## 2020-03-06 DIAGNOSIS — I4949 Other premature depolarization: Secondary | ICD-10-CM | POA: Diagnosis not present

## 2020-03-06 DIAGNOSIS — R079 Chest pain, unspecified: Secondary | ICD-10-CM | POA: Diagnosis not present

## 2020-03-06 DIAGNOSIS — Z8249 Family history of ischemic heart disease and other diseases of the circulatory system: Secondary | ICD-10-CM | POA: Diagnosis not present

## 2020-03-06 DIAGNOSIS — Z79899 Other long term (current) drug therapy: Secondary | ICD-10-CM | POA: Diagnosis not present

## 2020-03-06 DIAGNOSIS — Z950 Presence of cardiac pacemaker: Secondary | ICD-10-CM | POA: Diagnosis not present

## 2020-03-06 DIAGNOSIS — R0602 Shortness of breath: Secondary | ICD-10-CM | POA: Diagnosis not present

## 2020-03-11 DIAGNOSIS — R935 Abnormal findings on diagnostic imaging of other abdominal regions, including retroperitoneum: Secondary | ICD-10-CM | POA: Diagnosis not present

## 2020-03-11 DIAGNOSIS — F410 Panic disorder [episodic paroxysmal anxiety] without agoraphobia: Secondary | ICD-10-CM

## 2020-03-11 DIAGNOSIS — I441 Atrioventricular block, second degree: Secondary | ICD-10-CM | POA: Diagnosis not present

## 2020-03-11 DIAGNOSIS — Z950 Presence of cardiac pacemaker: Secondary | ICD-10-CM

## 2020-03-11 DIAGNOSIS — R5383 Other fatigue: Secondary | ICD-10-CM | POA: Diagnosis not present

## 2020-03-11 HISTORY — DX: Panic disorder (episodic paroxysmal anxiety): F41.0

## 2020-03-11 HISTORY — DX: Presence of cardiac pacemaker: Z95.0

## 2020-04-04 DIAGNOSIS — Z45018 Encounter for adjustment and management of other part of cardiac pacemaker: Secondary | ICD-10-CM | POA: Diagnosis not present

## 2020-04-04 DIAGNOSIS — I441 Atrioventricular block, second degree: Secondary | ICD-10-CM | POA: Diagnosis not present

## 2020-04-15 DIAGNOSIS — R935 Abnormal findings on diagnostic imaging of other abdominal regions, including retroperitoneum: Secondary | ICD-10-CM | POA: Diagnosis not present

## 2020-04-15 DIAGNOSIS — Z950 Presence of cardiac pacemaker: Secondary | ICD-10-CM | POA: Diagnosis not present

## 2020-04-15 DIAGNOSIS — I10 Essential (primary) hypertension: Secondary | ICD-10-CM

## 2020-04-15 DIAGNOSIS — Z8659 Personal history of other mental and behavioral disorders: Secondary | ICD-10-CM | POA: Insufficient documentation

## 2020-04-15 DIAGNOSIS — R5383 Other fatigue: Secondary | ICD-10-CM | POA: Diagnosis not present

## 2020-04-15 DIAGNOSIS — R413 Other amnesia: Secondary | ICD-10-CM

## 2020-04-15 DIAGNOSIS — I441 Atrioventricular block, second degree: Secondary | ICD-10-CM | POA: Diagnosis not present

## 2020-04-15 HISTORY — DX: Essential (primary) hypertension: I10

## 2020-04-15 HISTORY — DX: Other amnesia: R41.3

## 2020-05-07 DIAGNOSIS — R531 Weakness: Secondary | ICD-10-CM | POA: Diagnosis not present

## 2020-05-07 DIAGNOSIS — R519 Headache, unspecified: Secondary | ICD-10-CM | POA: Diagnosis not present

## 2020-05-07 DIAGNOSIS — R200 Anesthesia of skin: Secondary | ICD-10-CM | POA: Diagnosis not present

## 2020-05-07 DIAGNOSIS — G4489 Other headache syndrome: Secondary | ICD-10-CM | POA: Diagnosis not present

## 2020-05-07 DIAGNOSIS — E785 Hyperlipidemia, unspecified: Secondary | ICD-10-CM | POA: Diagnosis not present

## 2020-05-07 DIAGNOSIS — R6883 Chills (without fever): Secondary | ICD-10-CM | POA: Diagnosis not present

## 2020-05-07 DIAGNOSIS — Z79899 Other long term (current) drug therapy: Secondary | ICD-10-CM | POA: Diagnosis not present

## 2020-05-07 DIAGNOSIS — Z7982 Long term (current) use of aspirin: Secondary | ICD-10-CM | POA: Diagnosis not present

## 2020-05-07 DIAGNOSIS — R5381 Other malaise: Secondary | ICD-10-CM | POA: Diagnosis not present

## 2020-05-07 DIAGNOSIS — I499 Cardiac arrhythmia, unspecified: Secondary | ICD-10-CM | POA: Diagnosis not present

## 2020-05-07 DIAGNOSIS — Z950 Presence of cardiac pacemaker: Secondary | ICD-10-CM | POA: Diagnosis not present

## 2020-05-07 DIAGNOSIS — R457 State of emotional shock and stress, unspecified: Secondary | ICD-10-CM | POA: Diagnosis not present

## 2020-05-07 DIAGNOSIS — R450 Nervousness: Secondary | ICD-10-CM | POA: Diagnosis not present

## 2020-05-07 DIAGNOSIS — R0602 Shortness of breath: Secondary | ICD-10-CM | POA: Diagnosis not present

## 2020-05-07 DIAGNOSIS — G319 Degenerative disease of nervous system, unspecified: Secondary | ICD-10-CM | POA: Diagnosis not present

## 2020-07-04 DIAGNOSIS — Z45018 Encounter for adjustment and management of other part of cardiac pacemaker: Secondary | ICD-10-CM | POA: Diagnosis not present

## 2020-07-12 DIAGNOSIS — Z23 Encounter for immunization: Secondary | ICD-10-CM | POA: Diagnosis not present

## 2020-08-12 DIAGNOSIS — Z950 Presence of cardiac pacemaker: Secondary | ICD-10-CM | POA: Diagnosis not present

## 2020-08-12 DIAGNOSIS — R413 Other amnesia: Secondary | ICD-10-CM | POA: Diagnosis not present

## 2020-08-12 DIAGNOSIS — R079 Chest pain, unspecified: Secondary | ICD-10-CM | POA: Diagnosis not present

## 2020-08-12 DIAGNOSIS — R935 Abnormal findings on diagnostic imaging of other abdominal regions, including retroperitoneum: Secondary | ICD-10-CM | POA: Diagnosis not present

## 2020-08-12 DIAGNOSIS — R519 Headache, unspecified: Secondary | ICD-10-CM | POA: Insufficient documentation

## 2020-08-12 DIAGNOSIS — R634 Abnormal weight loss: Secondary | ICD-10-CM | POA: Diagnosis not present

## 2020-08-12 DIAGNOSIS — I10 Essential (primary) hypertension: Secondary | ICD-10-CM | POA: Diagnosis not present

## 2020-08-12 DIAGNOSIS — Z8619 Personal history of other infectious and parasitic diseases: Secondary | ICD-10-CM

## 2020-08-12 DIAGNOSIS — R5383 Other fatigue: Secondary | ICD-10-CM | POA: Diagnosis not present

## 2020-08-12 HISTORY — DX: Headache, unspecified: R51.9

## 2020-08-12 HISTORY — DX: Personal history of other infectious and parasitic diseases: Z86.19

## 2020-08-22 DIAGNOSIS — R079 Chest pain, unspecified: Secondary | ICD-10-CM | POA: Diagnosis not present

## 2020-08-22 DIAGNOSIS — Z950 Presence of cardiac pacemaker: Secondary | ICD-10-CM | POA: Diagnosis not present

## 2020-09-16 DIAGNOSIS — R935 Abnormal findings on diagnostic imaging of other abdominal regions, including retroperitoneum: Secondary | ICD-10-CM | POA: Diagnosis not present

## 2020-09-16 DIAGNOSIS — R634 Abnormal weight loss: Secondary | ICD-10-CM | POA: Diagnosis not present

## 2020-09-16 DIAGNOSIS — Z8619 Personal history of other infectious and parasitic diseases: Secondary | ICD-10-CM | POA: Diagnosis not present

## 2020-09-16 DIAGNOSIS — R079 Chest pain, unspecified: Secondary | ICD-10-CM | POA: Diagnosis not present

## 2020-09-16 DIAGNOSIS — R519 Headache, unspecified: Secondary | ICD-10-CM | POA: Diagnosis not present

## 2020-09-20 DIAGNOSIS — Z23 Encounter for immunization: Secondary | ICD-10-CM | POA: Diagnosis not present

## 2020-10-03 DIAGNOSIS — Z45018 Encounter for adjustment and management of other part of cardiac pacemaker: Secondary | ICD-10-CM | POA: Diagnosis not present

## 2020-12-16 ENCOUNTER — Emergency Department (HOSPITAL_COMMUNITY): Payer: Medicare Other

## 2020-12-16 ENCOUNTER — Emergency Department (HOSPITAL_COMMUNITY)
Admission: EM | Admit: 2020-12-16 | Discharge: 2020-12-16 | Disposition: A | Discharge status: Home / Self Care | Payer: Medicare Other | Attending: Emergency Medicine | Admitting: Emergency Medicine

## 2020-12-16 ENCOUNTER — Encounter (HOSPITAL_COMMUNITY): Payer: Self-pay | Admitting: Emergency Medicine

## 2020-12-16 DIAGNOSIS — Z87891 Personal history of nicotine dependence: Secondary | ICD-10-CM | POA: Insufficient documentation

## 2020-12-16 DIAGNOSIS — S60221A Contusion of right hand, initial encounter: Secondary | ICD-10-CM | POA: Diagnosis not present

## 2020-12-16 DIAGNOSIS — Z7982 Long term (current) use of aspirin: Secondary | ICD-10-CM | POA: Insufficient documentation

## 2020-12-16 DIAGNOSIS — X58XXXA Exposure to other specified factors, initial encounter: Secondary | ICD-10-CM | POA: Diagnosis not present

## 2020-12-16 DIAGNOSIS — Z859 Personal history of malignant neoplasm, unspecified: Secondary | ICD-10-CM | POA: Diagnosis not present

## 2020-12-16 DIAGNOSIS — S6991XA Unspecified injury of right wrist, hand and finger(s), initial encounter: Secondary | ICD-10-CM | POA: Diagnosis present

## 2020-12-16 MED ORDER — ACETAMINOPHEN 325 MG PO TABS
650.0000 mg | ORAL_TABLET | Freq: Once | ORAL | Status: AC
  Administered 2020-12-16: 650 mg via ORAL
  Filled 2020-12-16: qty 2

## 2020-12-16 MED ORDER — CEPHALEXIN 500 MG PO CAPS: 500.0000 mg | ORAL_CAPSULE | Freq: Four times a day (QID) | ORAL | 0 refills | Status: AC

## 2020-12-16 MED ORDER — ACETAMINOPHEN 325 MG PO TABS: 650.0000 mg | ORAL_TABLET | Freq: Four times a day (QID) | ORAL | 0 refills | Status: DC | PRN

## 2020-12-16 NOTE — Discharge Instructions (Addendum)
You have been seen and discharged from the emergency department.  The x-ray of the hand shows no fracture.  You are experiencing bruising and swelling of the right hand.  Follow-up with your primary provider for reevaluation.  Keep the Ace bandage in place.  Elevate the right hand, ice the right hand.  Take new prescriptions (antibiotic and Tylenol) and home medications as prescribed. If you have any worsening symptoms or further concerns for health please return to an emergency department for further evaluation.

## 2020-12-16 NOTE — ED Triage Notes (Signed)
Per pt, states her right hand all of a sudden started hurting, then became swollen and bruised-no injury or trauma-symptoms occurred spontaneously

## 2020-12-16 NOTE — ED Provider Notes (Signed)
[place] DEPT Provider Note   CSN: [id number] Arrival date & time: 12/16/20  1016     History Chief Complaint  Patient presents with  . Hand Pain    MALCOLM HETZ is a 80 y.o. female.  HPI   80 year old female who takes a baby aspirin a day presents to the emergency department with concern for right hand pain, swelling and bruising.  Patient states about 2 weeks ago she did have a superficial cut to the dorsal aspect of the right thumb, she treated it with Neosporin, it healed well without any complication.  This morning she did exercises like she usually does and then took her husband to his appointment.  She did have to maneuver a walker and get him out of the car but does not remember any specific injury.  She states while driving home the right hand spontaneously started to bruise and swell.  Denies any fever or systemic symptoms.  No numbness or discoloration of the tips of the fingers.  Past Medical History:  Diagnosis Date  . Allergy   . Cancer ([place])   . Facial nerve sensory disorder   . History of skin cancer     Patient Active Problem List   Diagnosis Date Noted  . Protein-calorie malnutrition ([place]) 08/17/2017  . Mild anemia 08/17/2017  . 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 Claudication ([place]) 06/26/2015  . Lower extremity edema 06/26/2015  . Shortness of breath 04/10/2015  . CAP (community acquired pneumonia) 03/15/2015  . Hyperlipidemia 09/24/2014  . Trigeminal neuralgia 09/24/2014  . History of skin cancer     Past Surgical History:  Procedure Laterality Date  . ABDOMINAL HYSTERECTOMY    . TONSILLECTOMY AND ADENOIDECTOMY       OB History   No obstetric history on file.     Family History  Problem Relation Age of Onset  . Heart disease Mother        CHF  . Heart disease Brother 75       stents  . Asthma Maternal Grandmother   . Hypertension Maternal Grandmother   . Hypertension Paternal Grandmother    . Cancer Paternal Grandfather        liver  . Cancer Son        brain   . Breast cancer Neg Hx     Social History   Tobacco Use  . Smoking status: Former Smoker    Packs/day: 0.50    Years: 16.00    Pack years: 8.00    Types: Cigarettes    Quit date: 11/02/1978    Years since quitting: 42.1  . Smokeless tobacco: Never Used  Substance Use Topics  . Alcohol use: Yes    Alcohol/week: 7.0 standard drinks    Types: 7 Standard drinks or equivalent per week    Comment: rare  . Drug use: No    Home Medications Prior to Admission medications   Medication Sig Start Date End Date Taking? Authorizing Provider  acetaminophen (TYLENOL) 325 MG tablet Take 2 tablets (650 mg total) by mouth every 6 (six) hours as needed for moderate pain. 12/16/20  Yes Nolin Grell, Alvin Critchley, DO  aspirin 325 MG tablet Take 325 mg by mouth daily.    [provider]  Multiple Vitamins-Minerals (ONE-A-DAY 50 PLUS PO) Take 1 tablet by mouth daily.    [provider]    Allergies    Carbamazepine  Review of Systems   Review of Systems  Constitutional:  Negative for fever.  Respiratory: Negative for shortness of breath.   Cardiovascular: Negative for chest pain.  Gastrointestinal: Negative for abdominal pain.  Musculoskeletal:       + Right hand swelling and discoloration  Skin: Positive for color change.  Neurological: Negative for numbness and headaches.  Hematological: Bruises/bleeds easily.    Physical Exam Updated Vital Signs BP (!) 156/80 (BP Location: Right Arm)   Pulse (!) 112   Temp 97.8 F (36.6 C) (Oral)   Resp 19   SpO2 98%   Physical Exam Vitals and nursing note reviewed.  Constitutional:      Appearance: Normal appearance.  HENT:     Head: Normocephalic.     Mouth/Throat:     Mouth: Mucous membranes are moist.  Cardiovascular:     Rate and Rhythm: Normal rate.  Pulmonary:     Effort: Pulmonary effort is normal. No respiratory distress.  Musculoskeletal:      Comments: Swelling and bruising to the dorsal aspect of the right hand extending from the wrist to the base of all 5 phalanges, normal capillary refill in all 5 fingers, equal palpable radial pulse, no crepitus, no extension of the forearm, slight redness at the base of all 5 phalanges  Skin:    General: Skin is warm.     Findings: Bruising present.  Neurological:     Mental Status: She is alert and oriented to person, place, and time. Mental status is at baseline.  Psychiatric:        Mood and Affect: Mood normal.     ED Results / Procedures / Treatments   Labs (all labs ordered are listed, but only abnormal results are displayed) Labs Reviewed - No data to display  EKG None  Radiology DG Hand Complete Right  Result Date: 12/16/2020 CLINICAL DATA:  Hand pain and swelling, no known injury, initial encounter EXAM: RIGHT HAND - COMPLETE 3+ VIEW COMPARISON:  None. FINDINGS: Degenerative changes are noted in the radiocarpal joint as well as first [place] joint. No acute fracture or dislocation is noted. Generalized soft tissue swelling is noted along the dorsal aspect of the hand consistent with the given clinical history. No radiopaque foreign body is noted. IMPRESSION: Soft tissue swelling without acute bony abnormality. Electronically Signed   By: Inez Catalina M.D.   On: 12/16/2020 10:56    Procedures Procedures   Medications Ordered in ED Medications  acetaminophen (TYLENOL) tablet 650 mg (has no administration in time range)    ED Course  I have reviewed the triage vital signs and the nursing notes.  Pertinent labs & imaging results that were available during my care of the patient were reviewed by me and considered in my medical decision making (see chart for details).    MDM Rules/Calculators/A&P                          80 year old female presents with "spontaneous" swelling and bruising of the right hand.  Patient potentially injured the hand while working out this morning  and helping her husband out of the car with a walker but cannot remember a specific traumatic event.  The dorsal aspect of the right hand is swollen, bruised extending from the right wrist to the base of all 5 phalanges, she otherwise appears neurovascularly intact, no discoloration extending to the fingers/nailbeds, she did have a cut a couple weeks ago but low suspicion for abscess given the sudden onset of this.  She  is only on a baby aspirin a day, x-ray shows no acute fracture.  Plan for Ace wrap, ice and elevation with pain control and antibiotic to prevent cellulitis.  She will follow up with her primary doctor in the next couple days.  Patient will be discharged and treated as an outpatient.  Discharge plan and strict return to ED precautions discussed, patient verbalizes understanding and agreement.  Final Clinical Impression(s) / ED Diagnoses Final diagnoses:  Contusion of right hand, initial encounter    Rx / DC Orders ED Discharge Orders         Ordered    acetaminophen (TYLENOL) 325 MG tablet  Every 6 hours PRN        12/16/20 1158           Annaleia Pence, Alvin Critchley, DO 12/16/20 1210

## 2021-02-24 DIAGNOSIS — K862 Cyst of pancreas: Secondary | ICD-10-CM

## 2021-02-24 HISTORY — DX: Cyst of pancreas: K86.2

## 2022-04-06 DIAGNOSIS — Z9181 History of falling: Secondary | ICD-10-CM | POA: Insufficient documentation

## 2022-04-06 HISTORY — DX: History of falling: Z91.81

## 2022-08-17 DIAGNOSIS — I442 Atrioventricular block, complete: Secondary | ICD-10-CM

## 2022-08-17 HISTORY — DX: Atrioventricular block, complete: I44.2

## 2022-12-27 ENCOUNTER — Emergency Department (HOSPITAL_COMMUNITY): Payer: Medicare Other

## 2022-12-27 ENCOUNTER — Other Ambulatory Visit: Payer: Self-pay

## 2022-12-27 ENCOUNTER — Emergency Department (HOSPITAL_COMMUNITY)
Admission: EM | Admit: 2022-12-27 | Discharge: 2022-12-27 | Disposition: A | Discharge status: Home / Self Care | Payer: Medicare Other | Attending: Emergency Medicine | Admitting: Emergency Medicine

## 2022-12-27 ENCOUNTER — Encounter (HOSPITAL_COMMUNITY): Payer: Self-pay | Admitting: Emergency Medicine

## 2022-12-27 DIAGNOSIS — Z7982 Long term (current) use of aspirin: Secondary | ICD-10-CM | POA: Insufficient documentation

## 2022-12-27 DIAGNOSIS — R000 Tachycardia, unspecified: Secondary | ICD-10-CM | POA: Diagnosis not present

## 2022-12-27 DIAGNOSIS — R600 Localized edema: Secondary | ICD-10-CM | POA: Diagnosis not present

## 2022-12-27 DIAGNOSIS — E876 Hypokalemia: Secondary | ICD-10-CM | POA: Insufficient documentation

## 2022-12-27 DIAGNOSIS — M7989 Other specified soft tissue disorders: Secondary | ICD-10-CM | POA: Diagnosis present

## 2022-12-27 LAB — CBC WITH DIFFERENTIAL/PLATELET
Abs Immature Granulocytes: 0.02 10*3/uL (ref 0.00–0.07)
Basophils Absolute: 0 10*3/uL (ref 0.0–0.1)
Basophils Relative: 1 %
Eosinophils Absolute: 0.1 10*3/uL (ref 0.0–0.5)
Eosinophils Relative: 1 %
HCT: 35.9 % — ABNORMAL LOW (ref 36.0–46.0)
Hemoglobin: 11.4 g/dL — ABNORMAL LOW (ref 12.0–15.0)
Immature Granulocytes: 0 %
Lymphocytes Relative: 18 %
Lymphs Abs: 1 10*3/uL (ref 0.7–4.0)
MCH: 30 pg (ref 26.0–34.0)
MCHC: 31.8 g/dL (ref 30.0–36.0)
MCV: 94.5 fL (ref 80.0–100.0)
Monocytes Absolute: 0.7 10*3/uL (ref 0.1–1.0)
Monocytes Relative: 12 %
Neutro Abs: 3.9 10*3/uL (ref 1.7–7.7)
Neutrophils Relative %: 68 %
Platelets: 282 10*3/uL (ref 150–400)
RBC: 3.8 MIL/uL — ABNORMAL LOW (ref 3.87–5.11)
RDW: 13.5 % (ref 11.5–15.5)
WBC: 5.7 10*3/uL (ref 4.0–10.5)
nRBC: 0 % (ref 0.0–0.2)

## 2022-12-27 LAB — BASIC METABOLIC PANEL
Anion gap: 7 (ref 5–15)
BUN: 27 mg/dL — ABNORMAL HIGH (ref 8–23)
CO2: 31 mmol/L (ref 22–32)
Calcium: 8.5 mg/dL — ABNORMAL LOW (ref 8.9–10.3)
Chloride: 102 mmol/L (ref 98–111)
Creatinine, Ser: 0.47 mg/dL (ref 0.44–1.00)
GFR, Estimated: >60 mL/min (ref >60)
Glucose, Bld: 99 mg/dL (ref 70–99)
Potassium: 2.6 mmol/L — CL (ref 3.5–5.1)
Sodium: 140 mmol/L (ref 135–145)

## 2022-12-27 LAB — COMPREHENSIVE METABOLIC PANEL
ALT: 16 U/L (ref 0–44)
AST: 17 U/L (ref 15–41)
Albumin: 2.6 g/dL — ABNORMAL LOW (ref 3.5–5.0)
Alkaline Phosphatase: 48 U/L (ref 38–126)
Anion gap: 9 (ref 5–15)
BUN: 26 mg/dL — ABNORMAL HIGH (ref 8–23)
CO2: 25 mmol/L (ref 22–32)
Calcium: 7.2 mg/dL — ABNORMAL LOW (ref 8.9–10.3)
Chloride: 107 mmol/L (ref 98–111)
Creatinine, Ser: 0.39 mg/dL — ABNORMAL LOW (ref 0.44–1.00)
GFR, Estimated: >60 mL/min (ref >60)
Glucose, Bld: 88 mg/dL (ref 70–99)
Potassium: 2.4 mmol/L — CL (ref 3.5–5.1)
Sodium: 141 mmol/L (ref 135–145)
Total Bilirubin: 0.4 mg/dL (ref 0.3–1.2)
Total Protein: 5.2 g/dL — ABNORMAL LOW (ref 6.5–8.1)

## 2022-12-27 LAB — TROPONIN I (HIGH SENSITIVITY)
Troponin I (High Sensitivity): 10 ng/L (ref <18)
Troponin I (High Sensitivity): 16 ng/L (ref <18)

## 2022-12-27 LAB — MAGNESIUM: Magnesium: 2.2 mg/dL (ref 1.7–2.4)

## 2022-12-27 LAB — BRAIN NATRIURETIC PEPTIDE: B Natriuretic Peptide: 160.9 pg/mL — ABNORMAL HIGH (ref 0.0–100.0)

## 2022-12-27 MED ORDER — POTASSIUM CHLORIDE CRYS ER 20 MEQ PO TBCR: 20.0000 meq | EXTENDED_RELEASE_TABLET | Freq: Two times a day (BID) | ORAL | 0 refills | Status: DC

## 2022-12-27 MED ORDER — POTASSIUM CHLORIDE CRYS ER 20 MEQ PO TBCR
40.0000 meq | EXTENDED_RELEASE_TABLET | Freq: Once | ORAL | Status: AC
  Administered 2022-12-27: 40 meq via ORAL
  Filled 2022-12-27: qty 2

## 2022-12-27 MED ORDER — POTASSIUM CHLORIDE 10 MEQ/100ML IV SOLN
10.0000 meq | Freq: Once | INTRAVENOUS | Status: AC
  Administered 2022-12-27: 10 meq via INTRAVENOUS
  Filled 2022-12-27: qty 100

## 2022-12-27 NOTE — ED Provider Notes (Signed)
[place] Provider Note   CSN: [id number] Arrival date & time: 12/27/22  1548     History  Chief Complaint  Patient presents with   Leg Swelling   Leg Pain    Brandy Conway is a 82 y.o. female.  This is a 82 year old female with history of hyperlipidemia, chronic lower extremity edema and claudication presented to the ED for leg swelling.  Patient states she has had chronic leg swelling and subjective shortness of breath for 1 month.  She also has had progressive left hip pain in which she has been diagnosed with osteoarthritis previously.  She feels like her left pain and left leg pain is getting worse.  She has been not using her compression stockings because it takes quite a bit of effort to put them on.  She stating her abdomen is a little more swollen and has been this way for couple months.  Per patient's daughter she was ambulating at home this morning without any issues, does not appear to be having any difficulty breathing while ambulating.  She denies any chest pain, nausea, vomiting, urinary incontinence or retention.     Home Medications Prior to Admission medications   Medication Sig Start Date End Date Taking? Authorizing Provider  potassium chloride SA (KLOR-CON M) 20 MEQ tablet Take 1 tablet (20 mEq total) by mouth 2 (two) times daily for 4 days. 12/27/22 12/31/22 Yes Jimmie Molly, MD  acetaminophen (TYLENOL) 325 MG tablet Take 2 tablets (650 mg total) by mouth every 6 (six) hours as needed for moderate pain. 12/16/20   Horton, Alvin Critchley, DO  aspirin 325 MG tablet Take 325 mg by mouth daily.    [provider]  Multiple Vitamins-Minerals (ONE-A-DAY 50 PLUS PO) Take 1 tablet by mouth daily.    [provider]      Allergies    Carbamazepine    Review of Systems   Review of Systems  Constitutional:  Negative for chills and fever.  Respiratory:  Positive for shortness of breath. Negative for cough.    Cardiovascular:  Negative for chest pain.  Genitourinary:  Negative for decreased urine volume and dysuria.  Neurological:  Negative for syncope.    Physical Exam Updated Vital Signs BP 133/70   Pulse 86   Temp 98 F (36.7 C) (Oral)   Resp 20   Ht '5\' 5"'$  (1.651 m)   Wt 55.4 kg   SpO2 100%   BMI 20.32 kg/m  Physical Exam Vitals and nursing note reviewed.  Constitutional:      General: She is not in acute distress.    Appearance: Normal appearance. She is not ill-appearing or toxic-appearing.  HENT:     Head: Normocephalic.     Nose: Nose normal. No congestion.     Mouth/Throat:     Mouth: Mucous membranes are moist.  Eyes:     Pupils: Pupils are equal, round, and reactive to light.  Cardiovascular:     Rate and Rhythm: Regular rhythm. Tachycardia present.     Pulses: Normal pulses.     Heart sounds: Normal heart sounds. No murmur heard.    No friction rub. No gallop.  Pulmonary:     Effort: Pulmonary effort is normal. No respiratory distress.     Breath sounds: No stridor. No wheezing or rales.  Abdominal:     General: There is no distension.     Palpations: Abdomen is soft.     Tenderness: There  is no abdominal tenderness. There is no guarding or rebound.  Musculoskeletal:     Right lower leg: Edema present.     Left lower leg: Edema present.  Skin:    General: Skin is warm.     Capillary Refill: Capillary refill takes less than 2 seconds.  Neurological:     General: No focal deficit present.     Mental Status: She is alert and oriented to person, place, and time.     ED Results / Procedures / Treatments   Labs (all labs ordered are listed, but only abnormal results are displayed) Labs Reviewed  CBC WITH DIFFERENTIAL/PLATELET - Abnormal; Notable for the following components:      Result Value   RBC 3.80 (*)    Hemoglobin 11.4 (*)    HCT 35.9 (*)    All other components within normal limits  COMPREHENSIVE METABOLIC PANEL - Abnormal; Notable for the  following components:   Potassium 2.4 (*)    BUN 26 (*)    Creatinine, Ser 0.39 (*)    Calcium 7.2 (*)    Total Protein 5.2 (*)    Albumin 2.6 (*)    All other components within normal limits  BRAIN NATRIURETIC PEPTIDE - Abnormal; Notable for the following components:   B Natriuretic Peptide 160.9 (*)    All other components within normal limits  BASIC METABOLIC PANEL - Abnormal; Notable for the following components:   Potassium 2.6 (*)    BUN 27 (*)    Calcium 8.5 (*)    All other components within normal limits  MAGNESIUM  TROPONIN I (HIGH SENSITIVITY)  TROPONIN I (HIGH SENSITIVITY)    EKG EKG Interpretation  Date/Time:  Sunday December 27 2022 20:13:39 EST Ventricular Rate:  85 PR Interval:  198 QRS Duration: 167 QT Interval:  477 QTC Calculation: 568 R Axis:   264 Text Interpretation: VENTRICULAR PACED RHYTHM Right atrial enlargement Nonspecific IVCD with LAD Lateral infarct, old Anterior infarct, acute (LAD) Baseline wander in lead(s) V1 >>> Acute MI <<< Confirmed by Blanchie Dessert [phone]) on 12/27/2022 8:29:23 PM  Radiology DG Chest 2 View  Result Date: 12/27/2022 CLINICAL DATA:  82 year old female presents for evaluation of dyspnea. EXAM: CHEST - 2 VIEW COMPARISON:  Imaging from April 15, 2015 is available for comparison. FINDINGS: EKG leads project over the chest. LEFT-sided dual lead pacer device in place. Mild cardiomegaly. Central pulmonary vascular congestion. No pneumothorax. No consolidation. Trace pleural effusion is possible. On limited assessment no acute skeletal process. IMPRESSION: Cardiomegaly with central pulmonary vascular congestion and possible trace pleural effusions. Electronically Signed   By: Zetta Bills M.D.   On: 12/27/2022 17:20   DG Hip Unilat W or Wo Pelvis 2-3 Views Left  Result Date: 12/27/2022 CLINICAL DATA:  Hip pain EXAM: DG HIP (WITH OR WITHOUT PELVIS) 2-3V LEFT COMPARISON:  None Available. FINDINGS: There is no evidence of hip  fracture or dislocation. 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 Moderate osteoarthritis of the contralateral right hip joint. Atherosclerotic vascular calcifications are present. 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 No fracture or dislocation. Electronically Signed   By: Davina Poke D.O.   On: 12/27/2022 17:20    Procedures Procedures    Medications Ordered in ED Medications  potassium chloride SA (KLOR-CON M) CR tablet 40 mEq (40 mEq Oral Given 12/27/22 2014)  potassium chloride 10 mEq in 100 mL IVPB (0 mEq Intravenous Stopped 12/27/22 2201)  potassium chloride SA (KLOR-CON M) CR  tablet 40 mEq (40 mEq Oral Given 12/27/22 2057)    ED Course/ Medical Decision Making/ A&P                             Medical Decision Making Patient presents with symptoms that have been worsening over the last month.  She is overall well-appearing, no acute distress, has increased bilateral lower extremity edema however she has been not wearing her compression stockings.  Patient's presentation likely secondary to chronic recent stasis versus congestive heart failure, less likely DVT given it is bilateral, no pain on palpation, and similar to her prior lower extremity edema.  Her lungs are clear to auscultation bilaterally with no focal findings suggestive of pneumonia, she has been afebrile.  She has no history of heart failure, she has chronic venous stasis, we will obtain CBC, CMP, BNP, chest x-ray, and EKG for further evaluation.  I personally reviewed and interpreted patient's labs which was significant for a potassium of 2.6 on recheck, creatinine normal at 0.47.  I rediscussed this with the patient who states she has not eaten anything since yesterday as she did not want to have to get up being go to the bathroom at church.  Patient given potassium replacement, serial troponins 10 and 16 respectively, BNP 160 which is slightly elevated from her prior 147.   Hemoglobin is stable at 11.4.    I personally reviewed and interpreted patient's chest x-ray which shows mild cardiomegaly, mild central pulmonary vascular congestion with no focal consolidations.    I personally reviewed and interpreted patient's EKG which shows ventricular paced rhythm, negative Sgarbossa criteria, no acute ischemic changes when compared to prior.  Patient given potassium replacements, total of 90 mEq.  I also will send her home with potassium replacements.  Upon reevaluation patient states that she feels fine, they would like to go home and follow-up outpatient.  I feel it is appropriate at this time as her workup has been negative.  She does not appear to be in acute heart failure exacerbation, she has no oxygen requirement, her BNP is stable, and she is able to ambulate without hypoxia or dyspnea.  Discussed strict return precautions if she has any worsening chest pain or shortness of breath.  Recommended close follow-up with PCP in 1 to 2 days for recheck of her BMP and discussion of her potassium.  Patient was stable at discharge.  Problems Addressed: Hypokalemia: acute illness or injury that poses a threat to life or bodily functions Leg edema: chronic illness or injury that poses a threat to life or bodily functions  Amount and/or Complexity of Data Reviewed Independent Historian: caregiver    Details: Daughter gives history Labs: ordered. Decision-making details documented in ED Course. Radiology: ordered and independent interpretation performed. Decision-making details documented in ED Course. ECG/medicine tests: ordered and independent interpretation performed. Decision-making details documented in ED Course.  Risk Prescription drug management.          Final Clinical Impression(s) / ED Diagnoses Final diagnoses:  Leg edema  Hypokalemia    Rx / DC Orders ED Discharge Orders          Ordered    potassium chloride SA (KLOR-CON M) 20 MEQ tablet  2  times daily        12/27/22 2147              Jimmie Molly, MD 12/27/22 2214    Blanchie Dessert, MD 12/31/22 [phone]

## 2022-12-27 NOTE — ED Triage Notes (Signed)
Pt here from home with c/o sob and some swelling in her feet and some trouble with her left leg working , started with hip pain

## 2022-12-27 NOTE — ED Notes (Signed)
Critical potassium of 2.4 called from lab. I reported to [facility] MD and [place] Ar RN.

## 2022-12-27 NOTE — ED Notes (Signed)
Date and time results received: 12/27/22 2003 (use smartphrase ".now" to insert current time)  Test: K+ Critical Value: 2.6  Name of Provider Notified: [email or web address]  Orders Received? Or Actions Taken?:

## 2022-12-27 NOTE — ED Provider Notes (Incomplete)
Patient presents with her family member due to having complaints of swelling in her legs over the last month but also a pain in her back and hip that runs down her legs that makes it hard for even to put compression socks on anymore.  She also feels a fullness in her abdomen and some mild shortness of breath with exertion.  Her family member who is with her reports that she has been trying to get her to go to her PCP or see somebody about possibly getting an injection in her back but she will not go.  She has not had cough or fever.  No specific infectious symptoms.  She does not have a history of CHF or take diuretics.  Patient's labs are relatively stable except is found to be severely hypokalemic today with a potassium of 2.4 and a repeat of 2.6.  Patient does not take diuretics or other medications that would cause the hypokalemia.  Unclear why patient is so hypokalemic.

## 2022-12-27 NOTE — Discharge Instructions (Addendum)
You were evaluated in the emergency department, your heart marker levels looked okay.  I am sending you home on potassium supplementation, please take as prescribed and follow-up with your PCP in 1 to 2 days for recheck and reevaluation.  Return to the ED if you have any weakness, syncope, chest pain or shortness of breath.

## 2023-02-13 DIAGNOSIS — G4710 Hypersomnia, unspecified: Secondary | ICD-10-CM

## 2023-02-13 DIAGNOSIS — E876 Hypokalemia: Secondary | ICD-10-CM

## 2023-02-13 DIAGNOSIS — R400 Somnolence: Secondary | ICD-10-CM

## 2023-02-13 DIAGNOSIS — R600 Localized edema: Secondary | ICD-10-CM

## 2023-02-13 HISTORY — DX: Somnolence: R40.0

## 2023-02-13 HISTORY — DX: Hypersomnia, unspecified: G47.10

## 2023-02-13 HISTORY — DX: Localized edema: R60.0

## 2023-04-02 DIAGNOSIS — Z950 Presence of cardiac pacemaker: Secondary | ICD-10-CM

## 2023-04-02 DIAGNOSIS — M6281 Muscle weakness (generalized): Secondary | ICD-10-CM

## 2023-04-02 DIAGNOSIS — I739 Peripheral vascular disease, unspecified: Secondary | ICD-10-CM | POA: Diagnosis not present

## 2023-04-02 DIAGNOSIS — R269 Unspecified abnormalities of gait and mobility: Secondary | ICD-10-CM

## 2023-04-02 DIAGNOSIS — I119 Hypertensive heart disease without heart failure: Secondary | ICD-10-CM | POA: Diagnosis not present

## 2023-04-02 DIAGNOSIS — G894 Chronic pain syndrome: Secondary | ICD-10-CM

## 2023-04-02 DIAGNOSIS — I442 Atrioventricular block, complete: Secondary | ICD-10-CM | POA: Diagnosis not present

## 2023-04-02 DIAGNOSIS — M1990 Unspecified osteoarthritis, unspecified site: Secondary | ICD-10-CM

## 2023-04-02 DIAGNOSIS — R600 Localized edema: Secondary | ICD-10-CM | POA: Diagnosis not present

## 2023-04-02 DIAGNOSIS — Z681 Body mass index (BMI) 19 or less, adult: Secondary | ICD-10-CM

## 2023-04-07 DIAGNOSIS — D649 Anemia, unspecified: Secondary | ICD-10-CM | POA: Diagnosis not present

## 2023-04-07 DIAGNOSIS — D75839 Thrombocytosis, unspecified: Secondary | ICD-10-CM | POA: Diagnosis not present

## 2023-04-07 DIAGNOSIS — R7303 Prediabetes: Secondary | ICD-10-CM | POA: Diagnosis not present

## 2023-04-07 DIAGNOSIS — E673 Hypervitaminosis D: Secondary | ICD-10-CM | POA: Diagnosis not present

## 2023-04-07 DIAGNOSIS — R799 Abnormal finding of blood chemistry, unspecified: Secondary | ICD-10-CM

## 2023-04-09 DIAGNOSIS — G894 Chronic pain syndrome: Secondary | ICD-10-CM | POA: Diagnosis not present

## 2023-04-09 DIAGNOSIS — R52 Pain, unspecified: Secondary | ICD-10-CM | POA: Diagnosis not present

## 2023-04-09 DIAGNOSIS — R5381 Other malaise: Secondary | ICD-10-CM | POA: Diagnosis not present

## 2023-04-09 DIAGNOSIS — R600 Localized edema: Secondary | ICD-10-CM | POA: Diagnosis not present

## 2023-04-14 DIAGNOSIS — R300 Dysuria: Secondary | ICD-10-CM

## 2023-04-14 DIAGNOSIS — R600 Localized edema: Secondary | ICD-10-CM

## 2023-04-14 DIAGNOSIS — S81801A Unspecified open wound, right lower leg, initial encounter: Secondary | ICD-10-CM | POA: Diagnosis not present

## 2023-04-14 DIAGNOSIS — R062 Wheezing: Secondary | ICD-10-CM | POA: Diagnosis not present

## 2023-04-14 DIAGNOSIS — S81802A Unspecified open wound, left lower leg, initial encounter: Secondary | ICD-10-CM | POA: Diagnosis not present

## 2023-04-14 DIAGNOSIS — L03116 Cellulitis of left lower limb: Secondary | ICD-10-CM | POA: Diagnosis not present

## 2023-04-15 DIAGNOSIS — R0989 Other specified symptoms and signs involving the circulatory and respiratory systems: Secondary | ICD-10-CM

## 2023-04-15 DIAGNOSIS — D649 Anemia, unspecified: Secondary | ICD-10-CM | POA: Diagnosis not present

## 2023-04-15 DIAGNOSIS — R059 Cough, unspecified: Secondary | ICD-10-CM

## 2023-04-15 DIAGNOSIS — R0602 Shortness of breath: Secondary | ICD-10-CM | POA: Diagnosis not present

## 2023-04-15 DIAGNOSIS — J189 Pneumonia, unspecified organism: Secondary | ICD-10-CM

## 2023-04-15 DIAGNOSIS — I509 Heart failure, unspecified: Secondary | ICD-10-CM | POA: Diagnosis not present

## 2023-04-15 DIAGNOSIS — M6281 Muscle weakness (generalized): Secondary | ICD-10-CM

## 2023-04-15 DIAGNOSIS — R269 Unspecified abnormalities of gait and mobility: Secondary | ICD-10-CM

## 2023-04-15 DIAGNOSIS — L03119 Cellulitis of unspecified part of limb: Secondary | ICD-10-CM | POA: Diagnosis not present

## 2023-04-15 DIAGNOSIS — M1990 Unspecified osteoarthritis, unspecified site: Secondary | ICD-10-CM

## 2023-04-15 DIAGNOSIS — R600 Localized edema: Secondary | ICD-10-CM

## 2023-04-16 DIAGNOSIS — I340 Nonrheumatic mitral (valve) insufficiency: Secondary | ICD-10-CM

## 2023-04-16 DIAGNOSIS — Z950 Presence of cardiac pacemaker: Secondary | ICD-10-CM

## 2023-04-16 DIAGNOSIS — J189 Pneumonia, unspecified organism: Secondary | ICD-10-CM

## 2023-04-16 DIAGNOSIS — I5041 Acute combined systolic (congestive) and diastolic (congestive) heart failure: Secondary | ICD-10-CM

## 2023-04-16 DIAGNOSIS — E785 Hyperlipidemia, unspecified: Secondary | ICD-10-CM

## 2023-04-17 DIAGNOSIS — Z950 Presence of cardiac pacemaker: Secondary | ICD-10-CM

## 2023-04-17 DIAGNOSIS — I5041 Acute combined systolic (congestive) and diastolic (congestive) heart failure: Secondary | ICD-10-CM

## 2023-04-17 DIAGNOSIS — I429 Cardiomyopathy, unspecified: Secondary | ICD-10-CM

## 2023-04-17 DIAGNOSIS — E785 Hyperlipidemia, unspecified: Secondary | ICD-10-CM

## 2023-04-19 DIAGNOSIS — I5032 Chronic diastolic (congestive) heart failure: Secondary | ICD-10-CM | POA: Diagnosis not present

## 2023-04-19 DIAGNOSIS — M6281 Muscle weakness (generalized): Secondary | ICD-10-CM

## 2023-04-19 DIAGNOSIS — R600 Localized edema: Secondary | ICD-10-CM | POA: Diagnosis not present

## 2023-04-19 DIAGNOSIS — J189 Pneumonia, unspecified organism: Secondary | ICD-10-CM | POA: Diagnosis not present

## 2023-04-19 DIAGNOSIS — I2510 Atherosclerotic heart disease of native coronary artery without angina pectoris: Secondary | ICD-10-CM | POA: Diagnosis not present

## 2023-04-19 DIAGNOSIS — R269 Unspecified abnormalities of gait and mobility: Secondary | ICD-10-CM

## 2023-04-21 DIAGNOSIS — R269 Unspecified abnormalities of gait and mobility: Secondary | ICD-10-CM

## 2023-04-21 DIAGNOSIS — M6281 Muscle weakness (generalized): Secondary | ICD-10-CM

## 2023-04-21 DIAGNOSIS — G4700 Insomnia, unspecified: Secondary | ICD-10-CM

## 2023-04-21 DIAGNOSIS — L89319 Pressure ulcer of right buttock, unspecified stage: Secondary | ICD-10-CM

## 2023-04-21 DIAGNOSIS — I5032 Chronic diastolic (congestive) heart failure: Secondary | ICD-10-CM | POA: Diagnosis not present

## 2023-04-21 DIAGNOSIS — J189 Pneumonia, unspecified organism: Secondary | ICD-10-CM | POA: Diagnosis not present

## 2023-04-21 DIAGNOSIS — R531 Weakness: Secondary | ICD-10-CM | POA: Diagnosis not present

## 2023-04-21 DIAGNOSIS — R0602 Shortness of breath: Secondary | ICD-10-CM | POA: Diagnosis not present

## 2023-04-22 ENCOUNTER — Encounter: Payer: Self-pay | Admitting: Internal Medicine

## 2023-04-22 DIAGNOSIS — I5032 Chronic diastolic (congestive) heart failure: Secondary | ICD-10-CM | POA: Diagnosis not present

## 2023-04-22 DIAGNOSIS — R600 Localized edema: Secondary | ICD-10-CM | POA: Diagnosis not present

## 2023-04-22 DIAGNOSIS — R52 Pain, unspecified: Secondary | ICD-10-CM | POA: Diagnosis not present

## 2023-04-22 DIAGNOSIS — R1310 Dysphagia, unspecified: Secondary | ICD-10-CM | POA: Diagnosis not present

## 2023-04-27 DIAGNOSIS — J189 Pneumonia, unspecified organism: Secondary | ICD-10-CM | POA: Diagnosis not present

## 2023-04-27 DIAGNOSIS — L89312 Pressure ulcer of right buttock, stage 2: Secondary | ICD-10-CM | POA: Diagnosis not present

## 2023-04-27 DIAGNOSIS — I110 Hypertensive heart disease with heart failure: Secondary | ICD-10-CM | POA: Diagnosis not present

## 2023-04-27 DIAGNOSIS — I5032 Chronic diastolic (congestive) heart failure: Secondary | ICD-10-CM | POA: Diagnosis not present

## 2023-04-28 DIAGNOSIS — R197 Diarrhea, unspecified: Secondary | ICD-10-CM | POA: Diagnosis not present

## 2023-04-28 DIAGNOSIS — D649 Anemia, unspecified: Secondary | ICD-10-CM | POA: Diagnosis not present

## 2023-04-28 DIAGNOSIS — R799 Abnormal finding of blood chemistry, unspecified: Secondary | ICD-10-CM | POA: Diagnosis not present

## 2023-04-28 DIAGNOSIS — D75839 Thrombocytosis, unspecified: Secondary | ICD-10-CM | POA: Diagnosis not present

## 2023-05-10 DIAGNOSIS — R635 Abnormal weight gain: Secondary | ICD-10-CM | POA: Diagnosis not present

## 2023-05-10 DIAGNOSIS — I5032 Chronic diastolic (congestive) heart failure: Secondary | ICD-10-CM | POA: Diagnosis not present

## 2023-05-10 DIAGNOSIS — R600 Localized edema: Secondary | ICD-10-CM | POA: Diagnosis not present

## 2023-05-14 DIAGNOSIS — R1084 Generalized abdominal pain: Secondary | ICD-10-CM | POA: Diagnosis not present

## 2023-05-14 DIAGNOSIS — R269 Unspecified abnormalities of gait and mobility: Secondary | ICD-10-CM

## 2023-05-14 DIAGNOSIS — M6281 Muscle weakness (generalized): Secondary | ICD-10-CM

## 2023-05-14 DIAGNOSIS — Z950 Presence of cardiac pacemaker: Secondary | ICD-10-CM

## 2023-05-14 DIAGNOSIS — E785 Hyperlipidemia, unspecified: Secondary | ICD-10-CM | POA: Diagnosis not present

## 2023-05-14 DIAGNOSIS — L03116 Cellulitis of left lower limb: Secondary | ICD-10-CM | POA: Diagnosis not present

## 2023-05-14 DIAGNOSIS — I5042 Chronic combined systolic (congestive) and diastolic (congestive) heart failure: Secondary | ICD-10-CM | POA: Diagnosis not present

## 2023-05-14 DIAGNOSIS — R600 Localized edema: Secondary | ICD-10-CM

## 2023-05-26 DIAGNOSIS — I5042 Chronic combined systolic (congestive) and diastolic (congestive) heart failure: Secondary | ICD-10-CM | POA: Diagnosis not present

## 2023-05-26 DIAGNOSIS — R635 Abnormal weight gain: Secondary | ICD-10-CM | POA: Diagnosis not present

## 2023-05-26 DIAGNOSIS — R600 Localized edema: Secondary | ICD-10-CM | POA: Diagnosis not present

## 2023-05-31 ENCOUNTER — Other Ambulatory Visit: Payer: Self-pay

## 2023-05-31 DIAGNOSIS — S90829A Blister (nonthermal), unspecified foot, initial encounter: Secondary | ICD-10-CM | POA: Diagnosis not present

## 2023-05-31 DIAGNOSIS — L03115 Cellulitis of right lower limb: Secondary | ICD-10-CM | POA: Insufficient documentation

## 2023-05-31 DIAGNOSIS — C801 Malignant (primary) neoplasm, unspecified: Secondary | ICD-10-CM | POA: Insufficient documentation

## 2023-05-31 DIAGNOSIS — I5032 Chronic diastolic (congestive) heart failure: Secondary | ICD-10-CM | POA: Diagnosis not present

## 2023-05-31 DIAGNOSIS — I5042 Chronic combined systolic (congestive) and diastolic (congestive) heart failure: Secondary | ICD-10-CM | POA: Insufficient documentation

## 2023-05-31 DIAGNOSIS — G518 Other disorders of facial nerve: Secondary | ICD-10-CM | POA: Insufficient documentation

## 2023-05-31 DIAGNOSIS — I429 Cardiomyopathy, unspecified: Secondary | ICD-10-CM | POA: Insufficient documentation

## 2023-05-31 DIAGNOSIS — R600 Localized edema: Secondary | ICD-10-CM | POA: Insufficient documentation

## 2023-05-31 DIAGNOSIS — L03116 Cellulitis of left lower limb: Secondary | ICD-10-CM | POA: Insufficient documentation

## 2023-05-31 DIAGNOSIS — R109 Unspecified abdominal pain: Secondary | ICD-10-CM | POA: Insufficient documentation

## 2023-05-31 DIAGNOSIS — T7840XA Allergy, unspecified, initial encounter: Secondary | ICD-10-CM | POA: Insufficient documentation

## 2023-06-08 ENCOUNTER — Ambulatory Visit: Payer: Medicare Other

## 2023-06-16 DIAGNOSIS — G500 Trigeminal neuralgia: Secondary | ICD-10-CM

## 2023-06-16 DIAGNOSIS — R5381 Other malaise: Secondary | ICD-10-CM

## 2023-06-16 DIAGNOSIS — R52 Pain, unspecified: Secondary | ICD-10-CM

## 2023-06-16 DIAGNOSIS — R600 Localized edema: Secondary | ICD-10-CM

## 2023-06-30 DIAGNOSIS — I509 Heart failure, unspecified: Secondary | ICD-10-CM | POA: Diagnosis not present

## 2023-06-30 DIAGNOSIS — R600 Localized edema: Secondary | ICD-10-CM | POA: Diagnosis not present

## 2023-06-30 DIAGNOSIS — S81801A Unspecified open wound, right lower leg, initial encounter: Secondary | ICD-10-CM | POA: Diagnosis not present

## 2023-07-14 DIAGNOSIS — I5032 Chronic diastolic (congestive) heart failure: Secondary | ICD-10-CM | POA: Diagnosis not present

## 2023-07-14 DIAGNOSIS — R54 Age-related physical debility: Secondary | ICD-10-CM | POA: Diagnosis not present

## 2023-07-14 DIAGNOSIS — R600 Localized edema: Secondary | ICD-10-CM | POA: Diagnosis not present
